# Patient Record
Sex: Male | Born: 1945
Health system: Southern US, Community
[De-identification: ages and names within clinical notes are randomized; demographics above are authoritative.]

## PROBLEM LIST (undated history)

## (undated) DIAGNOSIS — N529 Male erectile dysfunction, unspecified: Secondary | ICD-10-CM

## (undated) DIAGNOSIS — Q441 Other congenital malformations of gallbladder: Secondary | ICD-10-CM

## (undated) DIAGNOSIS — M109 Gout, unspecified: Secondary | ICD-10-CM

## (undated) DIAGNOSIS — W57XXXA Bitten or stung by nonvenomous insect and other nonvenomous arthropods, initial encounter: Secondary | ICD-10-CM

## (undated) DIAGNOSIS — K760 Fatty (change of) liver, not elsewhere classified: Secondary | ICD-10-CM

## (undated) DIAGNOSIS — E785 Hyperlipidemia, unspecified: Secondary | ICD-10-CM

## (undated) HISTORY — DX: Hyperlipidemia, unspecified: E78.5

## (undated) HISTORY — DX: Male erectile dysfunction, unspecified: N52.9

## (undated) HISTORY — DX: Fatty (change of) liver, not elsewhere classified: K76.0

## (undated) HISTORY — DX: Other congenital malformations of gallbladder: Q44.1

---

## 2013-01-14 HISTORY — PX: COLONOSCOPY W/ BIOPSIES: SHX1374

## 2016-02-13 DIAGNOSIS — Z23 Encounter for immunization: Secondary | ICD-10-CM | POA: Diagnosis not present

## 2016-04-05 DIAGNOSIS — E7800 Pure hypercholesterolemia, unspecified: Secondary | ICD-10-CM | POA: Diagnosis not present

## 2016-04-05 DIAGNOSIS — B354 Tinea corporis: Secondary | ICD-10-CM | POA: Diagnosis not present

## 2016-04-05 DIAGNOSIS — Z6828 Body mass index (BMI) 28.0-28.9, adult: Secondary | ICD-10-CM | POA: Diagnosis not present

## 2016-04-05 DIAGNOSIS — Z86010 Personal history of colonic polyps: Secondary | ICD-10-CM | POA: Diagnosis not present

## 2016-04-05 DIAGNOSIS — M109 Gout, unspecified: Secondary | ICD-10-CM | POA: Diagnosis not present

## 2016-05-27 DIAGNOSIS — M25512 Pain in left shoulder: Secondary | ICD-10-CM | POA: Diagnosis not present

## 2016-05-27 DIAGNOSIS — Z6828 Body mass index (BMI) 28.0-28.9, adult: Secondary | ICD-10-CM | POA: Diagnosis not present

## 2016-05-29 DIAGNOSIS — M7552 Bursitis of left shoulder: Secondary | ICD-10-CM | POA: Diagnosis not present

## 2016-05-29 DIAGNOSIS — M75112 Incomplete rotator cuff tear or rupture of left shoulder, not specified as traumatic: Secondary | ICD-10-CM | POA: Diagnosis not present

## 2016-05-29 DIAGNOSIS — M7542 Impingement syndrome of left shoulder: Secondary | ICD-10-CM | POA: Diagnosis not present

## 2016-06-07 DIAGNOSIS — M7542 Impingement syndrome of left shoulder: Secondary | ICD-10-CM | POA: Diagnosis not present

## 2016-06-07 DIAGNOSIS — M75112 Incomplete rotator cuff tear or rupture of left shoulder, not specified as traumatic: Secondary | ICD-10-CM | POA: Diagnosis not present

## 2016-06-07 DIAGNOSIS — M7552 Bursitis of left shoulder: Secondary | ICD-10-CM | POA: Diagnosis not present

## 2016-06-11 DIAGNOSIS — M75112 Incomplete rotator cuff tear or rupture of left shoulder, not specified as traumatic: Secondary | ICD-10-CM | POA: Diagnosis not present

## 2016-06-11 DIAGNOSIS — M7542 Impingement syndrome of left shoulder: Secondary | ICD-10-CM | POA: Diagnosis not present

## 2016-06-11 DIAGNOSIS — M7552 Bursitis of left shoulder: Secondary | ICD-10-CM | POA: Diagnosis not present

## 2016-06-14 DIAGNOSIS — M7542 Impingement syndrome of left shoulder: Secondary | ICD-10-CM | POA: Diagnosis not present

## 2016-06-14 DIAGNOSIS — M7552 Bursitis of left shoulder: Secondary | ICD-10-CM | POA: Diagnosis not present

## 2016-06-14 DIAGNOSIS — M75112 Incomplete rotator cuff tear or rupture of left shoulder, not specified as traumatic: Secondary | ICD-10-CM | POA: Diagnosis not present

## 2016-06-18 DIAGNOSIS — M7542 Impingement syndrome of left shoulder: Secondary | ICD-10-CM | POA: Diagnosis not present

## 2016-06-18 DIAGNOSIS — M75112 Incomplete rotator cuff tear or rupture of left shoulder, not specified as traumatic: Secondary | ICD-10-CM | POA: Diagnosis not present

## 2016-06-18 DIAGNOSIS — M7552 Bursitis of left shoulder: Secondary | ICD-10-CM | POA: Diagnosis not present

## 2016-06-21 DIAGNOSIS — M7552 Bursitis of left shoulder: Secondary | ICD-10-CM | POA: Diagnosis not present

## 2016-06-21 DIAGNOSIS — M75112 Incomplete rotator cuff tear or rupture of left shoulder, not specified as traumatic: Secondary | ICD-10-CM | POA: Diagnosis not present

## 2016-06-21 DIAGNOSIS — M7542 Impingement syndrome of left shoulder: Secondary | ICD-10-CM | POA: Diagnosis not present

## 2016-06-25 DIAGNOSIS — M7542 Impingement syndrome of left shoulder: Secondary | ICD-10-CM | POA: Diagnosis not present

## 2016-06-25 DIAGNOSIS — M75112 Incomplete rotator cuff tear or rupture of left shoulder, not specified as traumatic: Secondary | ICD-10-CM | POA: Diagnosis not present

## 2016-06-25 DIAGNOSIS — M7552 Bursitis of left shoulder: Secondary | ICD-10-CM | POA: Diagnosis not present

## 2016-06-26 DIAGNOSIS — M7542 Impingement syndrome of left shoulder: Secondary | ICD-10-CM | POA: Diagnosis not present

## 2016-06-26 DIAGNOSIS — M75112 Incomplete rotator cuff tear or rupture of left shoulder, not specified as traumatic: Secondary | ICD-10-CM | POA: Diagnosis not present

## 2016-06-26 DIAGNOSIS — M7552 Bursitis of left shoulder: Secondary | ICD-10-CM | POA: Diagnosis not present

## 2016-06-28 DIAGNOSIS — M75112 Incomplete rotator cuff tear or rupture of left shoulder, not specified as traumatic: Secondary | ICD-10-CM | POA: Diagnosis not present

## 2016-06-28 DIAGNOSIS — M7552 Bursitis of left shoulder: Secondary | ICD-10-CM | POA: Diagnosis not present

## 2016-06-28 DIAGNOSIS — M7542 Impingement syndrome of left shoulder: Secondary | ICD-10-CM | POA: Diagnosis not present

## 2016-07-02 DIAGNOSIS — M7542 Impingement syndrome of left shoulder: Secondary | ICD-10-CM | POA: Diagnosis not present

## 2016-07-02 DIAGNOSIS — M7552 Bursitis of left shoulder: Secondary | ICD-10-CM | POA: Diagnosis not present

## 2016-07-02 DIAGNOSIS — M75112 Incomplete rotator cuff tear or rupture of left shoulder, not specified as traumatic: Secondary | ICD-10-CM | POA: Diagnosis not present

## 2016-07-05 DIAGNOSIS — M7552 Bursitis of left shoulder: Secondary | ICD-10-CM | POA: Diagnosis not present

## 2016-07-05 DIAGNOSIS — M7542 Impingement syndrome of left shoulder: Secondary | ICD-10-CM | POA: Diagnosis not present

## 2016-07-05 DIAGNOSIS — M75112 Incomplete rotator cuff tear or rupture of left shoulder, not specified as traumatic: Secondary | ICD-10-CM | POA: Diagnosis not present

## 2016-07-09 DIAGNOSIS — M7552 Bursitis of left shoulder: Secondary | ICD-10-CM | POA: Diagnosis not present

## 2016-07-09 DIAGNOSIS — M75112 Incomplete rotator cuff tear or rupture of left shoulder, not specified as traumatic: Secondary | ICD-10-CM | POA: Diagnosis not present

## 2016-07-09 DIAGNOSIS — M7542 Impingement syndrome of left shoulder: Secondary | ICD-10-CM | POA: Diagnosis not present

## 2016-07-12 DIAGNOSIS — M75112 Incomplete rotator cuff tear or rupture of left shoulder, not specified as traumatic: Secondary | ICD-10-CM | POA: Diagnosis not present

## 2016-07-12 DIAGNOSIS — M7552 Bursitis of left shoulder: Secondary | ICD-10-CM | POA: Diagnosis not present

## 2016-07-12 DIAGNOSIS — M7542 Impingement syndrome of left shoulder: Secondary | ICD-10-CM | POA: Diagnosis not present

## 2016-07-16 DIAGNOSIS — M7542 Impingement syndrome of left shoulder: Secondary | ICD-10-CM | POA: Diagnosis not present

## 2016-07-16 DIAGNOSIS — M7552 Bursitis of left shoulder: Secondary | ICD-10-CM | POA: Diagnosis not present

## 2016-07-16 DIAGNOSIS — M75112 Incomplete rotator cuff tear or rupture of left shoulder, not specified as traumatic: Secondary | ICD-10-CM | POA: Diagnosis not present

## 2016-07-23 DIAGNOSIS — M75112 Incomplete rotator cuff tear or rupture of left shoulder, not specified as traumatic: Secondary | ICD-10-CM | POA: Diagnosis not present

## 2016-07-23 DIAGNOSIS — M7542 Impingement syndrome of left shoulder: Secondary | ICD-10-CM | POA: Diagnosis not present

## 2016-07-23 DIAGNOSIS — M7552 Bursitis of left shoulder: Secondary | ICD-10-CM | POA: Diagnosis not present

## 2016-07-26 DIAGNOSIS — M7542 Impingement syndrome of left shoulder: Secondary | ICD-10-CM | POA: Diagnosis not present

## 2016-07-26 DIAGNOSIS — M7552 Bursitis of left shoulder: Secondary | ICD-10-CM | POA: Diagnosis not present

## 2016-07-26 DIAGNOSIS — M75112 Incomplete rotator cuff tear or rupture of left shoulder, not specified as traumatic: Secondary | ICD-10-CM | POA: Diagnosis not present

## 2016-07-30 DIAGNOSIS — M75112 Incomplete rotator cuff tear or rupture of left shoulder, not specified as traumatic: Secondary | ICD-10-CM | POA: Diagnosis not present

## 2016-07-30 DIAGNOSIS — M7552 Bursitis of left shoulder: Secondary | ICD-10-CM | POA: Diagnosis not present

## 2016-07-30 DIAGNOSIS — M7542 Impingement syndrome of left shoulder: Secondary | ICD-10-CM | POA: Diagnosis not present

## 2016-08-02 DIAGNOSIS — M7552 Bursitis of left shoulder: Secondary | ICD-10-CM | POA: Diagnosis not present

## 2016-08-02 DIAGNOSIS — M75112 Incomplete rotator cuff tear or rupture of left shoulder, not specified as traumatic: Secondary | ICD-10-CM | POA: Diagnosis not present

## 2016-08-02 DIAGNOSIS — M7542 Impingement syndrome of left shoulder: Secondary | ICD-10-CM | POA: Diagnosis not present

## 2016-08-07 DIAGNOSIS — M7542 Impingement syndrome of left shoulder: Secondary | ICD-10-CM | POA: Diagnosis not present

## 2016-08-07 DIAGNOSIS — M7552 Bursitis of left shoulder: Secondary | ICD-10-CM | POA: Diagnosis not present

## 2016-08-07 DIAGNOSIS — M75112 Incomplete rotator cuff tear or rupture of left shoulder, not specified as traumatic: Secondary | ICD-10-CM | POA: Diagnosis not present

## 2016-09-26 DIAGNOSIS — R1011 Right upper quadrant pain: Secondary | ICD-10-CM | POA: Diagnosis not present

## 2016-09-26 DIAGNOSIS — Z6825 Body mass index (BMI) 25.0-25.9, adult: Secondary | ICD-10-CM | POA: Diagnosis not present

## 2016-09-26 DIAGNOSIS — R351 Nocturia: Secondary | ICD-10-CM | POA: Diagnosis not present

## 2016-09-26 DIAGNOSIS — E7800 Pure hypercholesterolemia, unspecified: Secondary | ICD-10-CM | POA: Diagnosis not present

## 2016-09-26 DIAGNOSIS — Z0000 Encounter for general adult medical examination without abnormal findings: Secondary | ICD-10-CM | POA: Diagnosis not present

## 2016-10-03 DIAGNOSIS — R351 Nocturia: Secondary | ICD-10-CM | POA: Diagnosis not present

## 2016-10-03 DIAGNOSIS — R1011 Right upper quadrant pain: Secondary | ICD-10-CM | POA: Diagnosis not present

## 2016-10-03 DIAGNOSIS — Z0000 Encounter for general adult medical examination without abnormal findings: Secondary | ICD-10-CM | POA: Diagnosis not present

## 2016-10-03 DIAGNOSIS — Z125 Encounter for screening for malignant neoplasm of prostate: Secondary | ICD-10-CM | POA: Diagnosis not present

## 2016-10-03 DIAGNOSIS — E7800 Pure hypercholesterolemia, unspecified: Secondary | ICD-10-CM | POA: Diagnosis not present

## 2016-10-07 DIAGNOSIS — Z23 Encounter for immunization: Secondary | ICD-10-CM | POA: Diagnosis not present

## 2016-10-10 DIAGNOSIS — R1011 Right upper quadrant pain: Secondary | ICD-10-CM | POA: Diagnosis not present

## 2016-10-10 DIAGNOSIS — R948 Abnormal results of function studies of other organs and systems: Secondary | ICD-10-CM | POA: Diagnosis not present

## 2016-10-15 DIAGNOSIS — K828 Other specified diseases of gallbladder: Secondary | ICD-10-CM | POA: Diagnosis not present

## 2017-06-11 ENCOUNTER — Encounter: Payer: Self-pay | Admitting: Internal Medicine

## 2017-07-29 DIAGNOSIS — Z13220 Encounter for screening for lipoid disorders: Secondary | ICD-10-CM | POA: Diagnosis not present

## 2017-07-29 DIAGNOSIS — Z131 Encounter for screening for diabetes mellitus: Secondary | ICD-10-CM | POA: Diagnosis not present

## 2017-07-29 DIAGNOSIS — K760 Fatty (change of) liver, not elsewhere classified: Secondary | ICD-10-CM | POA: Diagnosis not present

## 2017-07-29 DIAGNOSIS — Z0001 Encounter for general adult medical examination with abnormal findings: Secondary | ICD-10-CM | POA: Diagnosis not present

## 2017-07-29 DIAGNOSIS — E559 Vitamin D deficiency, unspecified: Secondary | ICD-10-CM | POA: Diagnosis not present

## 2017-07-29 DIAGNOSIS — N529 Male erectile dysfunction, unspecified: Secondary | ICD-10-CM | POA: Diagnosis not present

## 2017-07-29 DIAGNOSIS — R760 Raised antibody titer: Secondary | ICD-10-CM | POA: Diagnosis not present

## 2017-07-29 DIAGNOSIS — R5383 Other fatigue: Secondary | ICD-10-CM | POA: Diagnosis not present

## 2017-07-29 DIAGNOSIS — E785 Hyperlipidemia, unspecified: Secondary | ICD-10-CM | POA: Diagnosis not present

## 2017-07-29 DIAGNOSIS — M1009 Idiopathic gout, multiple sites: Secondary | ICD-10-CM | POA: Diagnosis not present

## 2017-08-07 ENCOUNTER — Ambulatory Visit: Payer: Self-pay | Admitting: Nurse Practitioner

## 2017-09-16 DIAGNOSIS — Z23 Encounter for immunization: Secondary | ICD-10-CM | POA: Diagnosis not present

## 2017-09-18 DIAGNOSIS — R5383 Other fatigue: Secondary | ICD-10-CM | POA: Diagnosis not present

## 2017-09-18 DIAGNOSIS — E785 Hyperlipidemia, unspecified: Secondary | ICD-10-CM | POA: Diagnosis not present

## 2017-12-24 DIAGNOSIS — K760 Fatty (change of) liver, not elsewhere classified: Secondary | ICD-10-CM | POA: Diagnosis not present

## 2017-12-24 DIAGNOSIS — M6281 Muscle weakness (generalized): Secondary | ICD-10-CM | POA: Diagnosis not present

## 2017-12-24 DIAGNOSIS — N529 Male erectile dysfunction, unspecified: Secondary | ICD-10-CM | POA: Diagnosis not present

## 2017-12-24 DIAGNOSIS — E785 Hyperlipidemia, unspecified: Secondary | ICD-10-CM | POA: Diagnosis not present

## 2018-01-26 DIAGNOSIS — E785 Hyperlipidemia, unspecified: Secondary | ICD-10-CM | POA: Diagnosis not present

## 2018-04-27 DIAGNOSIS — E785 Hyperlipidemia, unspecified: Secondary | ICD-10-CM | POA: Diagnosis not present

## 2018-04-27 DIAGNOSIS — L0390 Cellulitis, unspecified: Secondary | ICD-10-CM | POA: Diagnosis not present

## 2018-05-12 ENCOUNTER — Emergency Department (HOSPITAL_COMMUNITY)
Admission: EM | Admit: 2018-05-12 | Discharge: 2018-05-12 | Disposition: A | Discharge status: Home / Self Care | Payer: Medicare Other | Attending: Emergency Medicine | Admitting: Emergency Medicine

## 2018-05-12 ENCOUNTER — Encounter (HOSPITAL_COMMUNITY): Payer: Self-pay | Admitting: *Deleted

## 2018-05-12 ENCOUNTER — Emergency Department (HOSPITAL_COMMUNITY): Payer: Medicare Other

## 2018-05-12 ENCOUNTER — Other Ambulatory Visit: Payer: Self-pay

## 2018-05-12 DIAGNOSIS — S61316A Laceration without foreign body of right little finger with damage to nail, initial encounter: Secondary | ICD-10-CM | POA: Diagnosis not present

## 2018-05-12 DIAGNOSIS — Y9289 Other specified places as the place of occurrence of the external cause: Secondary | ICD-10-CM | POA: Diagnosis not present

## 2018-05-12 DIAGNOSIS — Y9389 Activity, other specified: Secondary | ICD-10-CM | POA: Insufficient documentation

## 2018-05-12 DIAGNOSIS — Y998 Other external cause status: Secondary | ICD-10-CM | POA: Insufficient documentation

## 2018-05-12 DIAGNOSIS — Z23 Encounter for immunization: Secondary | ICD-10-CM | POA: Insufficient documentation

## 2018-05-12 DIAGNOSIS — S61216A Laceration without foreign body of right little finger without damage to nail, initial encounter: Secondary | ICD-10-CM | POA: Insufficient documentation

## 2018-05-12 DIAGNOSIS — W230XXA Caught, crushed, jammed, or pinched between moving objects, initial encounter: Secondary | ICD-10-CM | POA: Diagnosis not present

## 2018-05-12 HISTORY — DX: Gout, unspecified: M10.9

## 2018-05-12 HISTORY — DX: Bitten or stung by nonvenomous insect and other nonvenomous arthropods, initial encounter: W57.XXXA

## 2018-05-12 MED ORDER — TETANUS-DIPHTH-ACELL PERTUSSIS 5-2.5-18.5 LF-MCG/0.5 IM SUSP
0.5000 mL | Freq: Once | INTRAMUSCULAR | Status: AC
  Administered 2018-05-12: 0.5 mL via INTRAMUSCULAR
  Filled 2018-05-12: qty 0.5

## 2018-05-12 MED ORDER — POVIDONE-IODINE 10 % EX SOLN
CUTANEOUS | Status: AC
  Administered 2018-05-12: 1
  Filled 2018-05-12: qty 15

## 2018-05-12 MED ORDER — IBUPROFEN 400 MG PO TABS: 400.0000 mg | ORAL_TABLET | Freq: Four times a day (QID) | ORAL | 0 refills | Status: DC | PRN

## 2018-05-12 MED ORDER — DOUBLE ANTIBIOTIC 500-10000 UNIT/GM EX OINT
TOPICAL_OINTMENT | Freq: Once | CUTANEOUS | Status: AC
  Administered 2018-05-12: 1 via TOPICAL
  Filled 2018-05-12: qty 2

## 2018-05-12 MED ORDER — BACITRACIN ZINC 500 UNIT/GM EX OINT: 1.0000 "application " | TOPICAL_OINTMENT | Freq: Two times a day (BID) | CUTANEOUS | 0 refills | Status: DC

## 2018-05-12 MED ORDER — CEPHALEXIN 500 MG PO CAPS: 500.0000 mg | ORAL_CAPSULE | Freq: Three times a day (TID) | ORAL | 0 refills | Status: AC

## 2018-05-12 MED ORDER — LIDOCAINE-EPINEPHRINE (PF) 2 %-1:200000 IJ SOLN
20.0000 mL | Freq: Once | INTRAMUSCULAR | Status: AC
  Administered 2018-05-12: 10 mL via INTRADERMAL
  Filled 2018-05-12: qty 20

## 2018-05-12 NOTE — ED Triage Notes (Signed)
Pt with laceration to right pinky finger from a propeller from an airplane operated from a remote controller. Last tetanus unknown.

## 2018-05-12 NOTE — ED Provider Notes (Signed)
[facility] EMERGENCY DEPARTMENT Provider Note   CSN: [id number] Arrival date & time: 05/12/18  1545    History   Chief Complaint Chief Complaint  Patient presents with  . Laceration    HPI Edward Williamson is a 73 y.o. male.     HPI  73 y/o male - accidentally got his finger caught in the prop of his small radio controlled airplane and caused multiple lacerations to his R small finger on the volar surface - he has no idea about last tdap.  Sx were acute in onset Occurred 1 hour ago Constant pain that is worse with palpatoin assocaited with some bleeding - not on anticoag. Applied pressure dressing prehospital  Past Medical History:  Diagnosis Date  . Gout   . Tick bite     There are no active problems to display for this patient.   History reviewed. No pertinent surgical history.      Home Medications    Prior to Admission medications   Medication Sig Start Date End Date Taking? Authorizing Provider  bacitracin ointment Apply 1 application topically 2 (two) times daily. 05/12/18   Noemi Chapel, MD  cephALEXin (KEFLEX) 500 MG capsule Take 1 capsule (500 mg total) by mouth 3 (three) times daily for 7 days. 05/12/18 05/19/18  Noemi Chapel, MD  ibuprofen (ADVIL) 400 MG tablet Take 1 tablet (400 mg total) by mouth every 6 (six) hours as needed. 05/12/18   Noemi Chapel, MD    Family History History reviewed. No pertinent family history.  Social History Social History   Tobacco Use  . Smoking status: Never Smoker  . Smokeless tobacco: Never Used  Substance Use Topics  . Alcohol use: Yes    Frequency: Never  . Drug use: Never     Allergies   Patient has no known allergies.   Review of Systems Review of Systems  Constitutional: Negative for fever.  Gastrointestinal: Negative for vomiting.  Skin: Positive for wound.       Laceration  Neurological: Negative for weakness and numbness.     Physical Exam Updated Vital Signs BP (!) 168/81 (BP Location:  Left Arm)   Pulse 92   Temp 97.6 F (36.4 C) (Oral)   Resp 12   Ht 1.803 m (5\' 11" )   Wt 83 kg   SpO2 99%   BMI 25.52 kg/m   Physical Exam Constitutional:      General: He is not in acute distress.    Appearance: He is well-developed. He is not diaphoretic.  HENT:     Head: Normocephalic.  Eyes:     General: No scleral icterus.    Conjunctiva/sclera: Conjunctivae normal.  Cardiovascular:     Rate and Rhythm: Normal rate and regular rhythm.  Pulmonary:     Effort: Pulmonary effort is normal.     Breath sounds: Normal breath sounds.  Musculoskeletal: Normal range of motion.        General: Tenderness ( ttp over the laceration site) present.  Skin:    General: Skin is warm and dry.     Comments: Laceration located on R small finger - volar surface from MCP through middle phalanx The Laceration is linear shaped 6 lac's somewhat parralell to eachother interconnecting The depth is 2 The length is 8 total cm  Neurological:     Mental Status: He is alert.     Coordination: Coordination normal.     Comments: Sensation and motor intact      ED Treatments /  Results  Labs (all labs ordered are listed, but only abnormal results are displayed) Labs Reviewed - No data to display  EKG None  Radiology Dg Finger Little Right  Result Date: 05/12/2018 CLINICAL DATA:  Laceration RIGHT little finger from an RC airplane propeller EXAM: RIGHT LITTLE FINGER 2+V COMPARISON:  None FINDINGS: Dressing artifacts and soft tissue irregularity at ulnar and volar aspects of RIGHT little finger. Osseous mineralization normal. Joint spaces preserved. No acute fracture, dislocation, or bone destruction. IMPRESSION: No acute osseous abnormalities. Electronically Signed   By: Lavonia Dana M.D.   On: 05/12/2018 16:28    Procedures .Marland KitchenLaceration Repair Date/Time: 05/12/2018 5:06 PM Performed by: Noemi Chapel, MD Authorized by: Noemi Chapel, MD   Consent:    Consent obtained:  Verbal   Consent  given by:  Patient   Risks discussed:  Infection, pain, need for additional repair, poor cosmetic result and poor wound healing   Alternatives discussed:  No treatment and delayed treatment Anesthesia (see MAR for exact dosages):    Anesthesia method:  Local infiltration   Local anesthetic:  Lidocaine 1% WITH epi Laceration details:    Location:  Finger   Finger location:  R small finger   Length (cm):  8   Depth (mm):  2 Repair type:    Repair type:  Simple Pre-procedure details:    Preparation:  Patient was prepped and draped in usual sterile fashion and imaging obtained to evaluate for foreign bodies Exploration:    Hemostasis achieved with:  Direct pressure   Wound exploration: wound explored through full range of motion and entire depth of wound probed and visualized     Wound extent: no fascia violation noted, no foreign bodies/material noted, no muscle damage noted, no nerve damage noted, no tendon damage noted, no underlying fracture noted and no vascular damage noted     Contaminated: no   Treatment:    Area cleansed with:  Betadine and saline   Amount of cleaning:  Extensive   Irrigation solution:  Sterile saline   Irrigation volume:  2000   Irrigation method:  Syringe Skin repair:    Repair method:  Sutures   Suture size:  4-0   Suture material:  Prolene   Suture technique:  Simple interrupted   Number of sutures:  17 Approximation:    Approximation:  Close Post-procedure details:    Dressing:  Antibiotic ointment, sterile dressing and non-adherent dressing   Patient tolerance of procedure:  Tolerated well, no immediate complications Comments:          (including critical care time)  Medications Ordered in ED Medications  polymixin-bacitracin (POLYSPORIN) ointment (has no administration in time range)  lidocaine-EPINEPHrine (XYLOCAINE W/EPI) 2 %-1:200000 (PF) injection 20 mL (10 mLs Intradermal Given by Other 05/12/18 1558)  Tdap (BOOSTRIX) injection 0.5 mL  (0.5 mLs Intramuscular Given 05/12/18 1556)  povidone-iodine (BETADINE) 10 % external solution (1 application  Given 7/67/34 1558)     Initial Impression / Assessment and Plan / ED Course  I have reviewed the triage vital signs and the nursing notes.  Pertinent labs & imaging results that were available during my care of the patient were reviewed by me and considered in my medical decision making (see chart for details).       Imaging obtained Block placed Primary repair Update tdap - wound care and finger splint Pt agreeable.  See wound repair note - these were not technically 6 lacerations individually as the rotor blade from the [facility]  plane caused a confluence of semi parallel lac's that were repaired with total of 17 sutures   Pt tolerate it  No FB, no lac's Normal flexor and extensor function and normal sensation pre and pos repair.  Given f/u instructions Agreeable.  Final Clinical Impressions(s) / ED Diagnoses   Final diagnoses:  Laceration of right little finger without foreign body without damage to nail, initial encounter    ED Discharge Orders         Ordered    ibuprofen (ADVIL) 400 MG tablet  Every 6 hours PRN     05/12/18 1710    bacitracin ointment  2 times daily     05/12/18 1710    cephALEXin (KEFLEX) 500 MG capsule  3 times daily     05/12/18 1710           Noemi Chapel, MD 05/12/18 1711

## 2018-05-12 NOTE — Discharge Instructions (Signed)
Have your doctor take the stitches out in 7-10 days -  If you have redness, increased pain / swelling / pus or fever - start taking keflex and see a doctor immediately.  Keep clean and dry for 72 hours - other than topical antibiotics, Motrin 3 times daily for pain

## 2018-05-21 DIAGNOSIS — E559 Vitamin D deficiency, unspecified: Secondary | ICD-10-CM | POA: Diagnosis not present

## 2018-05-21 DIAGNOSIS — S61219A Laceration without foreign body of unspecified finger without damage to nail, initial encounter: Secondary | ICD-10-CM | POA: Diagnosis not present

## 2018-05-21 DIAGNOSIS — K760 Fatty (change of) liver, not elsewhere classified: Secondary | ICD-10-CM | POA: Diagnosis not present

## 2018-05-21 DIAGNOSIS — E785 Hyperlipidemia, unspecified: Secondary | ICD-10-CM | POA: Diagnosis not present

## 2018-08-27 DIAGNOSIS — E559 Vitamin D deficiency, unspecified: Secondary | ICD-10-CM | POA: Diagnosis not present

## 2018-08-27 DIAGNOSIS — E785 Hyperlipidemia, unspecified: Secondary | ICD-10-CM | POA: Diagnosis not present

## 2018-08-27 DIAGNOSIS — K760 Fatty (change of) liver, not elsewhere classified: Secondary | ICD-10-CM | POA: Diagnosis not present

## 2018-10-29 ENCOUNTER — Ambulatory Visit (INDEPENDENT_AMBULATORY_CARE_PROVIDER_SITE_OTHER): Payer: Medicare Other

## 2018-10-29 ENCOUNTER — Other Ambulatory Visit: Payer: Self-pay

## 2018-10-29 DIAGNOSIS — Z23 Encounter for immunization: Secondary | ICD-10-CM | POA: Diagnosis not present

## 2018-12-24 ENCOUNTER — Encounter (INDEPENDENT_AMBULATORY_CARE_PROVIDER_SITE_OTHER): Payer: Self-pay | Admitting: Nurse Practitioner

## 2018-12-24 ENCOUNTER — Ambulatory Visit (INDEPENDENT_AMBULATORY_CARE_PROVIDER_SITE_OTHER): Payer: Medicare Other | Admitting: Internal Medicine

## 2018-12-24 ENCOUNTER — Ambulatory Visit (INDEPENDENT_AMBULATORY_CARE_PROVIDER_SITE_OTHER): Payer: Medicare Other | Admitting: Nurse Practitioner

## 2018-12-24 ENCOUNTER — Other Ambulatory Visit: Payer: Self-pay

## 2018-12-24 VITALS — BP 132/74 | HR 50 | Temp 97.6°F | Resp 14 | Ht 71.0 in | Wt 187.6 lb

## 2018-12-24 DIAGNOSIS — R351 Nocturia: Secondary | ICD-10-CM

## 2018-12-24 DIAGNOSIS — E785 Hyperlipidemia, unspecified: Secondary | ICD-10-CM | POA: Insufficient documentation

## 2018-12-24 DIAGNOSIS — Z1159 Encounter for screening for other viral diseases: Secondary | ICD-10-CM | POA: Diagnosis not present

## 2018-12-24 DIAGNOSIS — Z0001 Encounter for general adult medical examination with abnormal findings: Secondary | ICD-10-CM

## 2018-12-24 DIAGNOSIS — Z125 Encounter for screening for malignant neoplasm of prostate: Secondary | ICD-10-CM

## 2018-12-24 DIAGNOSIS — Z0000 Encounter for general adult medical examination without abnormal findings: Secondary | ICD-10-CM

## 2018-12-24 DIAGNOSIS — Z1211 Encounter for screening for malignant neoplasm of colon: Secondary | ICD-10-CM

## 2018-12-24 DIAGNOSIS — E559 Vitamin D deficiency, unspecified: Secondary | ICD-10-CM

## 2018-12-24 DIAGNOSIS — R39198 Other difficulties with micturition: Secondary | ICD-10-CM

## 2018-12-24 DIAGNOSIS — Z131 Encounter for screening for diabetes mellitus: Secondary | ICD-10-CM

## 2018-12-24 HISTORY — DX: Vitamin D deficiency, unspecified: E55.9

## 2018-12-24 NOTE — Patient Instructions (Signed)
Thank you for choosing [facility] as your medical provider! If you have any questions or concerns regarding your health care, please do not hesitate to call our office.  You are up-to-date with all vaccinations except for shingles.  If you decide that you want this please contact your pharmacy.  I will refer you to gastroenterology for colon cancer screening.  We will not do sexually transmitted infection screening per your request.  We will also screen for hepatitis C and prostate cancer today.  Further recommendations will be made based upon these results.  As we discussed we will hold off on additional medication recommendations for cholesterol until we see what her lipid panel shows.  I will also collect a vitamin D level today and I will let you know what these results show.  You may receive these results either in a letter in the mail or our office will call you.  If you have any questions prior to your next appointment for while you are awaiting lab results please do not hesitate to call us.  Please follow-up as scheduled in 3 months. We look forward to seeing you again soon! Have a great holiday season!!  At [facility] we value your feedback. You may receive a survey about your visit today. Please share your experience as we strive to create trusting relationships with our patients to provide genuine, compassionate, quality care.  We appreciate your understanding and patience as we review any laboratory studies, imaging, and other diagnostic tests that are ordered as we care for you. We do our best to address any and all results in a timely manner. If you do not hear about test results within 1 week, please do not hesitate to contact us. If we referred you to a specialist during your visit or ordered imaging testing, contact the office if you have not been contacted to be scheduled within 1 weeks.  We also encourage the use of MyChart, which contains your medical  information for your review as well. If you are not enrolled in this feature, an access code is on this after visit summary for your convenience. Thank you for allowing [place] to be involved in your care.

## 2018-12-24 NOTE — Progress Notes (Signed)
Subjective:  Patient ID: Edward Williamson, male    DOB: 06-02-1945  Age: 73 y.o. MRN: [id number]  CC:  Chief Complaint  Patient presents with  . Follow-up      HPI  This patient also presents today for a follow-up of chronic conditions.  Hyperlipidemia: He does have a history of hyperlipidemia and used to be on statin therapy but is no longer taking this.  Last lipid panel showed total cholesterol 219, HDL of 37, triglycerides 134, and LDL 150.  Current ASCVD risk score is approximately 29%.  However this is calculated using the [employer] of cardiology risk calculator which is generally used for patients aged 30-70.  He does continue on fish oil supplement.  Vitamin D deficiency.  He is currently on 10,000 IUs of vitamin D daily.  Past Medical History:  Diagnosis Date  . Erectile dysfunction   . Fatty liver   . 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 Gout   . Hyperlipidemia   . Tick bite   . Vitamin D deficiency       Family History  Problem Relation Age of Onset  . Scoliosis Mother   . Heart attack Father   . Heart disease Father   . Heart disease Brother   . Heart attack Maternal Grandmother   . Depression Paternal Grandfather        suicide    Social History   Social History Narrative  . Not on file   Social History   Tobacco Use  . Smoking status: Never Smoker  . Smokeless tobacco: Never Used  Substance Use Topics  . Alcohol use: Yes    Alcohol/week: 1.0 standard drinks    Types: 1 Cans of beer per week    Comment: once per week in summer; cocktail (1) every 1-2 weeks     Current Meds  Medication Sig  . allopurinol (ZYLOPRIM) 300 MG tablet Take 300 mg by mouth daily.  . Cholecalciferol 1.25 MG (50000 UT) TABS Take 2 tablets by mouth.  . Digestive Enzymes (DIGEST III PO) Take 1 tablet by mouth. As needed  . ibuprofen (ADVIL) 400 MG tablet Take 1 tablet (400 mg total) by mouth every 6 (six) hours as needed.  . Misc Natural Products (OSTEO  BI-FLEX ADV TRIPLE ST PO) Take 1 tablet by mouth.  . NON FORMULARY 1 tablet. Alpha CRS+  . omega-3 acid ethyl esters (LOVAZA) 1 g capsule Take 2 g by mouth 2 (two) times daily.  . vitamin C (ASCORBIC ACID) 500 MG tablet Take 500 mg by mouth daily.  . Zinc 100 MG TABS Take 1 tablet by mouth.    ROS:  Review of Systems  Constitutional: Negative.   Eyes: Positive for blurred vision.  Respiratory: Negative.   Cardiovascular: Negative.   Genitourinary: Negative for hematuria.       Nocturia, weakened stream towards end of micturation     Objective:   Today's Vitals: BP 132/74   Pulse (!) 50   Temp 97.6 F (36.4 C)   Resp 14   Ht 5\' 11"  (1.803 m)   Wt 187 lb 9.6 oz (85.1 kg)   SpO2 95%   BMI 26.16 kg/m  Vitals with BMI 12/24/2018 05/12/2018 05/12/2018  Height 5\' 11"  - -  Weight 187 lbs 10 oz - -  BMI 99991111 - -  Systolic Q000111Q Q000111Q [id number]  Diastolic 74 80 81  Pulse 50 - 92  Physical Exam Vitals reviewed.  Constitutional:      Appearance: Normal appearance.  HENT:     Head: Normocephalic and atraumatic.  Cardiovascular:     Rate and Rhythm: Normal rate and regular rhythm.  Pulmonary:     Effort: Pulmonary effort is normal.     Breath sounds: Normal breath sounds.  Musculoskeletal:     Cervical back: Neck supple.  Skin:    General: Skin is warm and dry.  Neurological:     Mental Status: He is alert and oriented to person, place, and time.  Psychiatric:        Mood and Affect: Mood normal.        Behavior: Behavior normal.        Thought Content: Thought content normal.        Judgment: Judgment normal.          Assessment   1. Screen for colon cancer   2. Need for hepatitis C screening test   3. Hyperlipidemia, unspecified hyperlipidemia type   4. Screening for diabetes mellitus   5. Vitamin D deficiency   6. Prostate cancer screening   7. Encounter for Medicare annual wellness exam   8. Nocturia   9. Decreased urine stream       Tests  ordered Orders Placed This Encounter  Procedures  . Hep C Antibody  . Lipid Panel  . Vitamin D, 25-hydroxy  . Basic Metabolic Panel  . PSA  . Ambulatory referral to Gastroenterology     Plan: Please see assessment and plan per problem list below.   No orders of the defined types were placed in this encounter.   Patient to follow-up in 3 months.  Ailene Ards, NP

## 2018-12-24 NOTE — Assessment & Plan Note (Signed)
He will continue on current supplement dose.  We will collect serum level today for further evaluation.  Further recommendations may be made based upon these results.

## 2018-12-24 NOTE — Assessment & Plan Note (Signed)
We will collect fasting lipid panel today for further evaluation.  I did discuss risks and benefits of treatment with statin and/or aspirin therapy.  He has been trying to eat healthy and has maintained some level of physical activity but has not been exercising very regularly.  Will await blood work results before proceeding with treatment plan.

## 2018-12-24 NOTE — Progress Notes (Signed)
Subjective:   Edward Williamson is a 73 y.o. male who presents for Medicare Annual/Subsequent preventive examination.  Review of Systems:  Negative except for as listed in progress note for office visit dated 12/24/2018       Objective:    Vitals: BP 132/74   Pulse (!) 50   Temp 97.6 F (36.4 C)   Resp 14   Ht 5\' 11"  (1.803 m)   Wt 187 lb 9.6 oz (85.1 kg)   SpO2 95%   BMI 26.16 kg/m   Body mass index is 26.16 kg/m.  Advanced Directives 12/24/2018  Does Patient Have a Medical Advance Directive? Yes  Type of Advance Directive Living will  Does patient want to make changes to medical advance directive? No - Patient declined    Tobacco Social History   Tobacco Use  Smoking Status Never Smoker  Smokeless Tobacco Never Used     Counseling given: No   Clinical Intake:  Pre-visit preparation completed: Yes  Pain : No/denies pain     Nutritional Status: BMI of 19-24  Normal Nutritional Risks: None Diabetes: No  How often do you need to have someone help you when you read instructions, pamphlets, or other written materials from your doctor or pharmacy?: 1 - Never What is the last grade level you completed in school?: 4-year college degree  Interpreter Needed?: No  Information entered by :: Jeralyn Ruths, NP-C  Past Medical History:  Diagnosis Date  . Erectile dysfunction   . Fatty liver   . 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 Gout   . Hyperlipidemia   . Tick bite   . Vitamin D deficiency   . Vitamin D deficiency 12/24/2018   History reviewed. No pertinent surgical history. Family History  Problem Relation Age of Onset  . Scoliosis Mother   . Heart attack Father   . Heart disease Father   . Heart disease Brother   . Heart attack Maternal Grandmother   . Depression Paternal Grandfather        suicide   Social History   Socioeconomic History  . Marital status: Divorced    Spouse name: Not on file  . Number of children: 2  . Years of education: 16  years  . Highest education level: Not on file  Occupational History  . Occupation: Retired  . Occupation: Curator  Tobacco Use  . Smoking status: Never Smoker  . Smokeless tobacco: Never Used  Substance and Sexual Activity  . Alcohol use: Yes    Alcohol/week: 1.0 standard drinks    Types: 1 Cans of beer per week    Comment: once per week in summer; cocktail (1) every 1-2 weeks  . Drug use: Never  . Sexual activity: Not on file  Other Topics Concern  . Not on file  Social History Narrative  . Not on file   Social Determinants of Health   Financial Resource Strain:   . Difficulty of Paying Living Expenses: Not on file  Food Insecurity:   . Worried About Charity fundraiser in the Last Year: Not on file  . Ran Out of Food in the Last Year: Not on file  Transportation Needs:   . Lack of Transportation (Medical): Not on file  . Lack of Transportation (Non-Medical): Not on file  Physical Activity:   . Days of Exercise per Week: Not on file  . Minutes of Exercise per Session: Not on file  Stress:   .  Feeling of Stress : Not on file  Social Connections:   . Frequency of Communication with Friends and Family: Not on file  . Frequency of Social Gatherings with Friends and Family: Not on file  . Attends Religious Services: Not on file  . Active Member of Clubs or Organizations: Not on file  . Attends Archivist Meetings: Not on file  . Marital Status: Not on file    Outpatient Encounter Medications as of 12/24/2018  Medication Sig  . allopurinol (ZYLOPRIM) 300 MG tablet Take 300 mg by mouth daily.  . Cholecalciferol 1.25 MG (50000 UT) TABS Take 2 tablets by mouth.  . Digestive Enzymes (DIGEST III PO) Take 1 tablet by mouth. As needed  . ibuprofen (ADVIL) 400 MG tablet Take 1 tablet (400 mg total) by mouth every 6 (six) hours as needed.  . Misc Natural Products (OSTEO BI-FLEX ADV TRIPLE ST PO) Take 1 tablet by mouth.  . NON FORMULARY 1 tablet.  Alpha CRS+  . omega-3 acid ethyl esters (LOVAZA) 1 g capsule Take 2 g by mouth 2 (two) times daily.  . vitamin C (ASCORBIC ACID) 500 MG tablet Take 500 mg by mouth daily.  . Zinc 100 MG TABS Take 1 tablet by mouth.  . [DISCONTINUED] bacitracin ointment Apply 1 application topically 2 (two) times daily.   No facility-administered encounter medications on file as of 12/24/2018.    Activities of Daily Living In your present state of health, do you have any difficulty performing the following activities: 12/24/2018  Hearing? N  Vision? N  Difficulty concentrating or making decisions? N  Walking or climbing stairs? N  Dressing or bathing? N  Doing errands, shopping? N  Some recent data might be hidden    Patient Care Team: Doree Albee, MD as PCP - General (Internal Medicine) Gala Romney Cristopher Estimable, MD as Consulting Physician (Gastroenterology)   Assessment:   This is a routine wellness examination for Edward Williamson.  Exercise Activities and Dietary recommendations    Goals   None     Fall Risk Fall Risk  12/24/2018  Falls in the past year? 0   Is the patient's home free of loose throw rugs in walkways, pet beds, electrical cords, etc?   yes      Grab bars in the bathroom? no      Handrails on the stairs?   no not applicable      Adequate lighting?   yes    Depression Screen PHQ 2/9 Scores 12/24/2018  PHQ - 2 Score 0    Cognitive Function     6CIT Screen 12/24/2018  What Year? 0 points  What month? 0 points  What time? 0 points  Count back from 20 0 points  Months in reverse 0 points  Repeat phrase 0 points  Total Score 0    Immunization History  Administered Date(s) Administered  . Fluad Quad(high Dose 65+) 10/29/2018  . Pneumococcal Conjugate-13 10/14/2016  . Pneumococcal Polysaccharide-23 11/05/2013  . Tdap 05/12/2018    Qualifies for Shingles Vaccine? yes  Screening Tests Health Maintenance  Topic Date Due  . Hepatitis C Screening  02/13/1945  .  COLONOSCOPY  11/09/1993  . TETANUS/TDAP  05/11/2028  . INFLUENZA VACCINE  Completed  . PNA vac Low Risk Adult  Completed   Cancer Screenings: Lung: Low Dose CT Chest recommended if Age 34-80 years, 30 pack-year currently smoking OR have quit w/in 15years. Patient does not qualify. Colorectal: Due now  Additional Screenings:  Hepatitis  C Screening: Will complete today      Plan:   Patient is up-to-date with all immunizations except for shingles vaccine.  We do not provide this in this office, thus I told him if he would like this vaccine to discuss this with his pharmacy.  He tells me he understands.  He is due for colon cancer screening, sexual transmitted infection screening, and hepatitis C screening.  We will refer him to gastroenterology for colon cancer screening and collect blood work for hepatitis C screening today.  He declined to have sexual transmitted infection screening completed today.  He also asked about prostate cancer screening especially because he has been experiencing nocturia and weak stream of urine towards the end of micturition.  Last PSA was collected by his previous provider in 2018 and was reported as 3.5.  I had a long risk versus benefits conversation with this patient and he would like to have prostate cancer screening via PSA today.  I will order this blood test today.  He also asked about possible screenings for pancreatic cancer and I did inform him that there are no good screening test for pancreatic cancer.  He does have intermittent abdominal pain related to gallbladder disease.  I encouraged him that if his pain returns let us know we could consider CT scan of the abdomen to evaluate the cause of the pain.  He tells me he understands.  We also discussed advance care planning today.  He does not want to fill out a MOST form but tells me he does have a living will in place.  He will try to bring a copy of this to this office.  I have personally reviewed and  noted the following in the patient's chart:   . Medical and social history . Use of alcohol, tobacco or illicit drugs  . Current medications and supplements . Functional ability and status . Nutritional status . Physical activity . Advanced directives . List of other physicians . Hospitalizations, surgeries, and ER visits in previous 12 months . Vitals . Screenings to include cognitive, depression, and falls . Referrals and appointments  In addition, I have reviewed and discussed with patient certain preventive protocols, quality metrics, and best practice recommendations. A written personalized care plan for preventive services as well as general preventive health recommendations were provided to patient.  In addition to performing his annual Medicare wellness visit I did perform an office visit.  Please look at progress note for office visit dated 12/24/2018.     Ailene Ards, NP  12/24/2018

## 2018-12-25 ENCOUNTER — Other Ambulatory Visit (INDEPENDENT_AMBULATORY_CARE_PROVIDER_SITE_OTHER): Payer: Self-pay | Admitting: Nurse Practitioner

## 2018-12-25 ENCOUNTER — Telehealth (INDEPENDENT_AMBULATORY_CARE_PROVIDER_SITE_OTHER): Payer: Self-pay | Admitting: Nurse Practitioner

## 2018-12-25 LAB — BASIC METABOLIC PANEL
BUN: 16 mg/dL (ref 7–25)
CO2: 25 mmol/L (ref 20–32)
Calcium: 9.6 mg/dL (ref 8.6–10.3)
Chloride: 104 mmol/L (ref 98–110)
Creat: 0.88 mg/dL (ref 0.70–1.18)
Glucose, Bld: 91 mg/dL (ref 65–99)
Potassium: 5.1 mmol/L (ref 3.5–5.3)
Sodium: 140 mmol/L (ref 135–146)

## 2018-12-25 LAB — HEPATITIS C ANTIBODY
Hepatitis C Ab: NONREACTIVE
SIGNAL TO CUT-OFF: 0.02 (ref <1.00)

## 2018-12-25 LAB — LIPID PANEL
Cholesterol: 228 mg/dL — ABNORMAL HIGH (ref <200)
HDL: 46 mg/dL (ref >=40)
LDL Cholesterol (Calc): 161 mg/dL (calc) — ABNORMAL HIGH
Non-HDL Cholesterol (Calc): 182 mg/dL (calc) — ABNORMAL HIGH (ref <130)
Total CHOL/HDL Ratio: 5 (calc) — ABNORMAL HIGH (ref <5.0)
Triglycerides: 98 mg/dL (ref <150)

## 2018-12-25 LAB — VITAMIN D 25 HYDROXY (VIT D DEFICIENCY, FRACTURES): Vit D, 25-Hydroxy: 98 ng/mL (ref 30–100)

## 2018-12-25 LAB — PSA: PSA: 3.2 ng/mL (ref <=4.0)

## 2018-12-25 NOTE — Telephone Encounter (Signed)
I forgot to add that I also discussed the patient's lipid panel results.  We discussed risks and benefits versus statin therapy especially in a male of his age.  He would like to hold off on statin therapy trying to control his cholesterol with lifestyle changes.

## 2018-12-25 NOTE — Telephone Encounter (Signed)
I did call this patient today to discuss his blood work results.  I also consulted with Dr. Anastasio Champion regarding them.  It was recommended that the patient have PSA total and free percentage checked for further evaluation of PSA above 3 in the presence of LUTS.  The patient is agreeable to this and has been scheduled for follow-up lab draw next week.

## 2018-12-28 ENCOUNTER — Other Ambulatory Visit: Payer: Self-pay

## 2018-12-28 ENCOUNTER — Other Ambulatory Visit (INDEPENDENT_AMBULATORY_CARE_PROVIDER_SITE_OTHER): Payer: Self-pay | Admitting: Nurse Practitioner

## 2018-12-28 ENCOUNTER — Other Ambulatory Visit (INDEPENDENT_AMBULATORY_CARE_PROVIDER_SITE_OTHER): Payer: Self-pay | Admitting: Internal Medicine

## 2018-12-28 ENCOUNTER — Other Ambulatory Visit (INDEPENDENT_AMBULATORY_CARE_PROVIDER_SITE_OTHER): Payer: Medicare Other

## 2018-12-28 DIAGNOSIS — R39198 Other difficulties with micturition: Secondary | ICD-10-CM

## 2018-12-28 DIAGNOSIS — Z125 Encounter for screening for malignant neoplasm of prostate: Secondary | ICD-10-CM

## 2018-12-28 DIAGNOSIS — R351 Nocturia: Secondary | ICD-10-CM

## 2018-12-28 DIAGNOSIS — R972 Elevated prostate specific antigen [PSA]: Secondary | ICD-10-CM

## 2018-12-29 ENCOUNTER — Encounter: Payer: Self-pay | Admitting: Internal Medicine

## 2018-12-29 LAB — PSA, TOTAL AND FREE
PSA, % Free: 26 % (calc) (ref >25)
PSA, Free: 1 ng/mL
PSA, Total: 3.9 ng/mL (ref <=4.0)

## 2019-01-04 ENCOUNTER — Telehealth (INDEPENDENT_AMBULATORY_CARE_PROVIDER_SITE_OTHER): Payer: Self-pay | Admitting: Nurse Practitioner

## 2019-01-04 NOTE — Telephone Encounter (Signed)
I called this patient today to discuss his PSA total and free values.  I did tell him that his results indicate possible 8% risk of prostate cancer.  I also discussed that this is not a replacement for prostate biopsy.  This patient is not interested in undergoing biopsy, and does not want referral to urology.  I also discussed possible medications that he can take to treat his LUTS symptoms.  He tells me he would rather not take medication and less it is medically necessary.  For now we will hold off on referral to urology as well as starting medication.  He is encouraged to notify me if he would like referral to urology or if he has any other questions or concerns.  He told me he understood.

## 2019-01-26 DIAGNOSIS — H25011 Cortical age-related cataract, right eye: Secondary | ICD-10-CM | POA: Diagnosis not present

## 2019-01-26 LAB — HM DIABETES EYE EXAM

## 2019-01-27 ENCOUNTER — Telehealth (INDEPENDENT_AMBULATORY_CARE_PROVIDER_SITE_OTHER): Payer: Self-pay

## 2019-01-27 NOTE — Telephone Encounter (Signed)
Make an appointment for him to be seen either tomorrow by me(if schedule allows) or by Judson Roch next week.

## 2019-01-27 NOTE — Telephone Encounter (Signed)
4 days ago had a low blood sugar spell. Pt felt dizzines, loss balance,sweting nausea to stomach. Made it to be lay down. Take some orange juice and it was around 8AM. Wants to know what would you like to do at this time. Blood work he is willing to come do until he can see him.

## 2019-01-28 NOTE — Telephone Encounter (Signed)
Appt is set for 1/25 @ noon. ;he did not want to take earlier appt with sarah.

## 2019-02-01 ENCOUNTER — Encounter: Payer: Self-pay | Admitting: Nurse Practitioner

## 2019-02-01 ENCOUNTER — Other Ambulatory Visit: Payer: Self-pay

## 2019-02-01 ENCOUNTER — Ambulatory Visit (INDEPENDENT_AMBULATORY_CARE_PROVIDER_SITE_OTHER): Payer: Medicare Other | Admitting: Nurse Practitioner

## 2019-02-01 DIAGNOSIS — Z860100 Personal history of colon polyps, unspecified: Secondary | ICD-10-CM | POA: Insufficient documentation

## 2019-02-01 NOTE — H&P (View-Only) (Signed)
Primary Care Physician:  Doree Albee, MD Primary Gastroenterologist:  Dr. Gala Romney  Chief Complaint  Patient presents with  . Consult    TCS last done 2015    HPI:   Edward Williamson is a 74 y.o. male who presents on referral from primary care for scheduling of colonoscopy.  Nurse/phone triage was deferred office visit due to age.  Reviewed information associated with referral including office visit 12/24/2018 for follow-up of chronic conditions.  At that time labs were ordered and recommended referral to GI for colon cancer screening.  No history of colonoscopy or endoscopy in our system.  Today he states he is doing okay overall.  Indicates last colonoscopy completed in 2015 at [facility] in [place], [place]  by Dr. Zebedee Iba.  He brought the colonoscopy report with him today. Findings included three 4-7 mm polyps in the mid-transverse, hepatic flexure, and mid-ascending colons. He said the path report was benign.  Recommended 5 year repeat. Denies abdominal pain recently (has occasional gallbladder attack, last 8 months ago; HIDA 2014 with EF 10-20%). Denies N/V, hematochezia, melena, fever, chills, unintentional weight loss.Denies URI or flu-like symptoms. Denies loss of sense of taste or smell. Denies chest pain, dyspnea, dizziness, lightheadedness, syncope, near syncope. Denies any other upper or lower GI symptoms.  Past Medical History:  Diagnosis Date  . Erectile dysfunction   . Fatty liver   . 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 Gout   . Hyperlipidemia   . Tick bite   . Vitamin D deficiency   . Vitamin D deficiency 12/24/2018    Past Surgical History:  Procedure Laterality Date  . COLONOSCOPY W/ BIOPSIES  2015   Polyps x 3, no path available; reccomended repeat 5 years (2020)    Current Outpatient Medications  Medication Sig Dispense Refill  . allopurinol (ZYLOPRIM) 300 MG tablet Take 300 mg by mouth daily.    . CHOLECALCIFEROL PO Vit d3 + k2    . NON  FORMULARY 1 tablet. Alpha CRS+, xEO Mega, Microplex,VMz    . vitamin C (ASCORBIC ACID) 500 MG tablet Take 500 mg by mouth daily.    . Zinc 100 MG TABS Take 1 tablet by mouth.     No current facility-administered medications for this visit.    Allergies as of 02/01/2019  . (No Known Allergies)    Family History  Problem Relation Age of Onset  . Scoliosis Mother   . Heart attack Father   . Heart disease Father   . Heart disease Brother   . Heart attack Maternal Grandmother   . Depression Paternal Grandfather        suicide  . Colon cancer Neg Hx     Social History   Socioeconomic History  . Marital status: Divorced    Spouse name: Not on file  . Number of children: 2  . Years of education: 16 years  . Highest education level: Not on file  Occupational History  . Occupation: Retired  . Occupation: Curator  Tobacco Use  . Smoking status: Never Smoker  . Smokeless tobacco: Never Used  Substance and Sexual Activity  . Alcohol use: Yes    Alcohol/week: 1.0 standard drinks    Types: 1 Cans of beer per week    Comment: once per week in summer; cocktail (1) every 1-2 weeks  . Drug use: Never  . Sexual activity: Not on file  Other Topics Concern  .  Not on file  Social History Narrative  . Not on file   Social Determinants of Health   Financial Resource Strain:   . Difficulty of Paying Living Expenses: Not on file  Food Insecurity:   . Worried About Charity fundraiser in the Last Year: Not on file  . Ran Out of Food in the Last Year: Not on file  Transportation Needs:   . Lack of Transportation (Medical): Not on file  . Lack of Transportation (Non-Medical): Not on file  Physical Activity:   . Days of Exercise per Week: Not on file  . Minutes of Exercise per Session: Not on file  Stress:   . Feeling of Stress : Not on file  Social Connections:   . Frequency of Communication with Friends and Family: Not on file  . Frequency of Social  Gatherings with Friends and Family: Not on file  . Attends Religious Services: Not on file  . Active Member of Clubs or Organizations: Not on file  . Attends Archivist Meetings: Not on file  . Marital Status: Not on file  Intimate Partner Violence:   . Fear of Current or Ex-Partner: Not on file  . Emotionally Abused: Not on file  . Physically Abused: Not on file  . Sexually Abused: Not on file    Review of Systems: General: Negative for anorexia, weight loss, fever, chills, fatigue, weakness. ENT: Negative for hoarseness, difficulty swallowing. CV: Negative for chest pain, angina, palpitations, peripheral edema.  Respiratory: Negative for dyspnea at rest, cough, sputum, wheezing.  GI: See history of present illness.  MS: Negative for joint pain, low back pain.  Derm: Negative for rash or itching.  Endo: Negative for unusual weight change.  Heme: Negative for bruising or bleeding. Allergy: Negative for rash or hives.    Physical Exam: BP (!) 151/84   Pulse (!) 58   Temp (!) 97 F (36.1 C) (Oral)   Ht 5\' 11"  (1.803 m)   Wt 190 lb 6.4 oz (86.4 kg)   BMI 26.56 kg/m  General:   Alert and oriented. Pleasant and cooperative. Well-nourished and well-developed. Appears younger than stated age. Head:  Normocephalic and atraumatic. Eyes:  Without icterus, sclera clear and conjunctiva pink.  Ears:  Normal auditory acuity. Cardiovascular:  S1, S2 present without murmurs appreciated. Extremities without clubbing or edema. Respiratory:  Clear to auscultation bilaterally. No wheezes, rales, or rhonchi. No distress.  Gastrointestinal:  +BS, soft, non-tender and non-distended. No HSM noted. No guarding or rebound. No masses appreciated.  Rectal:  Deferred  Musculoskalatal:  Symmetrical without gross deformities. Neurologic:  Alert and oriented x4;  grossly normal neurologically. Psych:  Alert and cooperative. Normal mood and affect. Heme/Lymph/Immune: No excessive bruising  noted.    02/01/2019 10:19 AM   Disclaimer: This note was dictated with voice recognition software. Similar sounding words can inadvertently be transcribed and may not be corrected upon review.

## 2019-02-01 NOTE — Patient Instructions (Addendum)
Your health issues we discussed today were:   Need for colonoscopy: 1. Thank you for bringing your previous records with you today.  We will ensure this gets entered into our system to be available for providers in the [facility] system 2. We will schedule your colonoscopy for you 3. Further recommendations will follow your colonoscopy 4. Call us if you have any problems or significant bleeding noted.  Overall I recommend:  1. Continue your current medications 2. Return for follow-up based on recommendations made after your procedure 3. Call us if you have any questions or concerns.  ---------------------------------------------------------------  COVID-19 Vaccine Information can be found at: [email or web address].uk For questions related to vaccine distribution or appointments, please email [email or web address] or call [phone].   ---------------------------------------------------------------  At [facility] we value your feedback. You may receive a survey about your visit today. Please share your experience as we strive to create trusting relationships with our patients to provide genuine, compassionate, quality care.  We appreciate your understanding and patience as we review any laboratory studies, imaging, and other diagnostic tests that are ordered as we care for you. Our office policy is 5 business days for review of these results, and any emergent or urgent results are addressed in a timely manner for your best interest. If you do not hear from our office in 1 week, please contact us.   We also encourage the use of MyChart, which contains your medical information for your review as well. If you are not enrolled in this feature, an access code is on this after visit summary for your convenience. Thank you for allowing [place] to be involved in your care.  It was great to see you today!  I hope you have a Happy New  Year!!

## 2019-02-01 NOTE — Progress Notes (Signed)
Cc'ed to pcp °

## 2019-02-01 NOTE — Progress Notes (Signed)
Primary Care Physician:  Doree Albee, MD Primary Gastroenterologist:  Dr. Gala Romney  Chief Complaint  Patient presents with  . Consult    TCS last done 2015    HPI:   Edward Williamson is a 74 y.o. male who presents on referral from primary care for scheduling of colonoscopy.  Nurse/phone triage was deferred office visit due to age.  Reviewed information associated with referral including office visit 12/24/2018 for follow-up of chronic conditions.  At that time labs were ordered and recommended referral to GI for colon cancer screening.  No history of colonoscopy or endoscopy in our system.  Today he states he is doing okay overall.  Indicates last colonoscopy completed in 2015 at [facility] in [place], [place]  by Dr. Zebedee Iba.  He brought the colonoscopy report with him today. Findings included three 4-7 mm polyps in the mid-transverse, hepatic flexure, and mid-ascending colons. He said the path report was benign.  Recommended 5 year repeat. Denies abdominal pain recently (has occasional gallbladder attack, last 8 months ago; HIDA 2014 with EF 10-20%). Denies N/V, hematochezia, melena, fever, chills, unintentional weight loss.Denies URI or flu-like symptoms. Denies loss of sense of taste or smell. Denies chest pain, dyspnea, dizziness, lightheadedness, syncope, near syncope. Denies any other upper or lower GI symptoms.  Past Medical History:  Diagnosis Date  . Erectile dysfunction   . Fatty liver   . 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 Gout   . Hyperlipidemia   . Tick bite   . Vitamin D deficiency   . Vitamin D deficiency 12/24/2018    Past Surgical History:  Procedure Laterality Date  . COLONOSCOPY W/ BIOPSIES  2015   Polyps x 3, no path available; reccomended repeat 5 years (2020)    Current Outpatient Medications  Medication Sig Dispense Refill  . allopurinol (ZYLOPRIM) 300 MG tablet Take 300 mg by mouth daily.    . CHOLECALCIFEROL PO Vit d3 + k2    . NON  FORMULARY 1 tablet. Alpha CRS+, xEO Mega, Microplex,VMz    . vitamin C (ASCORBIC ACID) 500 MG tablet Take 500 mg by mouth daily.    . Zinc 100 MG TABS Take 1 tablet by mouth.     No current facility-administered medications for this visit.    Allergies as of 02/01/2019  . (No Known Allergies)    Family History  Problem Relation Age of Onset  . Scoliosis Mother   . Heart attack Father   . Heart disease Father   . Heart disease Brother   . Heart attack Maternal Grandmother   . Depression Paternal Grandfather        suicide  . Colon cancer Neg Hx     Social History   Socioeconomic History  . Marital status: Divorced    Spouse name: Not on file  . Number of children: 2  . Years of education: 16 years  . Highest education level: Not on file  Occupational History  . Occupation: Retired  . Occupation: Curator  Tobacco Use  . Smoking status: Never Smoker  . Smokeless tobacco: Never Used  Substance and Sexual Activity  . Alcohol use: Yes    Alcohol/week: 1.0 standard drinks    Types: 1 Cans of beer per week    Comment: once per week in summer; cocktail (1) every 1-2 weeks  . Drug use: Never  . Sexual activity: Not on file  Other Topics Concern  .  Not on file  Social History Narrative  . Not on file   Social Determinants of Health   Financial Resource Strain:   . Difficulty of Paying Living Expenses: Not on file  Food Insecurity:   . Worried About Charity fundraiser in the Last Year: Not on file  . Ran Out of Food in the Last Year: Not on file  Transportation Needs:   . Lack of Transportation (Medical): Not on file  . Lack of Transportation (Non-Medical): Not on file  Physical Activity:   . Days of Exercise per Week: Not on file  . Minutes of Exercise per Session: Not on file  Stress:   . Feeling of Stress : Not on file  Social Connections:   . Frequency of Communication with Friends and Family: Not on file  . Frequency of Social  Gatherings with Friends and Family: Not on file  . Attends Religious Services: Not on file  . Active Member of Clubs or Organizations: Not on file  . Attends Archivist Meetings: Not on file  . Marital Status: Not on file  Intimate Partner Violence:   . Fear of Current or Ex-Partner: Not on file  . Emotionally Abused: Not on file  . Physically Abused: Not on file  . Sexually Abused: Not on file    Review of Systems: General: Negative for anorexia, weight loss, fever, chills, fatigue, weakness. ENT: Negative for hoarseness, difficulty swallowing. CV: Negative for chest pain, angina, palpitations, peripheral edema.  Respiratory: Negative for dyspnea at rest, cough, sputum, wheezing.  GI: See history of present illness.  MS: Negative for joint pain, low back pain.  Derm: Negative for rash or itching.  Endo: Negative for unusual weight change.  Heme: Negative for bruising or bleeding. Allergy: Negative for rash or hives.    Physical Exam: BP (!) 151/84   Pulse (!) 58   Temp (!) 97 F (36.1 C) (Oral)   Ht 5\' 11"  (1.803 m)   Wt 190 lb 6.4 oz (86.4 kg)   BMI 26.56 kg/m  General:   Alert and oriented. Pleasant and cooperative. Well-nourished and well-developed. Appears younger than stated age. Head:  Normocephalic and atraumatic. Eyes:  Without icterus, sclera clear and conjunctiva pink.  Ears:  Normal auditory acuity. Cardiovascular:  S1, S2 present without murmurs appreciated. Extremities without clubbing or edema. Respiratory:  Clear to auscultation bilaterally. No wheezes, rales, or rhonchi. No distress.  Gastrointestinal:  +BS, soft, non-tender and non-distended. No HSM noted. No guarding or rebound. No masses appreciated.  Rectal:  Deferred  Musculoskalatal:  Symmetrical without gross deformities. Neurologic:  Alert and oriented x4;  grossly normal neurologically. Psych:  Alert and cooperative. Normal mood and affect. Heme/Lymph/Immune: No excessive bruising  noted.    02/01/2019 10:19 AM   Disclaimer: This note was dictated with voice recognition software. Similar sounding words can inadvertently be transcribed and may not be corrected upon review.

## 2019-02-01 NOTE — Assessment & Plan Note (Signed)
The patient is a high risk for colon cancer due to previous history of polyps.  His last colonoscopy was in 2015.  He brought the colonoscopy report with him which will be for understanding.  Findings included a total of three 4-7 mm polyps.  Pathology report not available to but the patient states he was told they were "benign".  No family history of colon cancer.  Recommended 5-year repeat exam.  He presents today approximately 1 year overdue for colonoscopy. He appears to be younger than his stated age, appears to be in excellent health.  We will proceed with colonoscopy at this time.  I discussed with him likely delay in procedure due to COVID-19 pandemic and he said this is no problem.  Further recommendations to follow colonoscopy.  Previous records marked for scanning.  Proceed with TCS with Dr. Gala Romney in near future: the risks, benefits, and alternatives have been discussed with the patient in detail. The patient states understanding and desires to proceed.  The patient is not on any anticoagulants, anxiolytics, chronic pain medications, antidepressants, antidiabetics, or iron supplements.  Denies alcohol and drug use.  Conscious sedation should be adequate for his procedure.

## 2019-02-08 ENCOUNTER — Encounter (INDEPENDENT_AMBULATORY_CARE_PROVIDER_SITE_OTHER): Payer: Self-pay | Admitting: Internal Medicine

## 2019-02-08 ENCOUNTER — Ambulatory Visit (INDEPENDENT_AMBULATORY_CARE_PROVIDER_SITE_OTHER): Payer: Medicare Other | Admitting: Internal Medicine

## 2019-02-08 ENCOUNTER — Other Ambulatory Visit: Payer: Self-pay

## 2019-02-08 ENCOUNTER — Other Ambulatory Visit (INDEPENDENT_AMBULATORY_CARE_PROVIDER_SITE_OTHER): Payer: Self-pay

## 2019-02-08 VITALS — BP 145/89 | HR 65 | Temp 97.6°F | Resp 18 | Ht 71.0 in | Wt 185.0 lb

## 2019-02-08 DIAGNOSIS — R42 Dizziness and giddiness: Secondary | ICD-10-CM

## 2019-02-08 NOTE — Progress Notes (Signed)
Metrics: Intervention Frequency ACO  Documented Smoking Status Yearly  Screened one or more times in 24 months  Cessation Counseling or  Active cessation medication Past 24 months  Past 24 months   Guideline developer: UpToDate (See UpToDate for funding source) Date Released: 2014       Wellness Office Visit  Subjective:  Patient ID: Edward Williamson, male    DOB: Apr 07, 1945  Age: 74 y.o. MRN: [id number]  CC: This man comes in for an acute visit with chief complaint of dizziness. HPI Just over 2 weeks ago, he had an episode in the morning where he suddenly became dizzy.  He felt he was going to lose consciousness.  This was exacerbated when he tried to stand up from a sitting position.  The whole episode lasted 2 to 3 hours before he felt back to normal.  He denied any movement of his surroundings around him.  He thinks this was triggered by just some eye movement or head movement.  He has had similar episodes in the past with movement of his head.  On no occasion has he lost consciousness. He denied any chest pain or palpitations with this episode.  Past Medical History:  Diagnosis Date  . Erectile dysfunction   . Fatty liver   . 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 Gout   . Hyperlipidemia   . Tick bite   . Vitamin D deficiency   . Vitamin D deficiency 12/24/2018      Family History  Problem Relation Age of Onset  . Scoliosis Mother   . Heart attack Father   . Heart disease Father   . Heart disease Brother   . Heart attack Maternal Grandmother   . Depression Paternal Grandfather        suicide  . Colon cancer Neg Hx     Social History   Social History Narrative  . Not on file   Social History   Tobacco Use  . Smoking status: Never Smoker  . Smokeless tobacco: Never Used  Substance Use Topics  . Alcohol use: Yes    Alcohol/week: 1.0 standard drinks    Types: 1 Cans of beer per week    Comment: once per week in summer; cocktail (1) every 1-2 weeks     Current Meds  Medication Sig  . allopurinol (ZYLOPRIM) 300 MG tablet Take 300 mg by mouth daily.  . CHOLECALCIFEROL PO Vit d3 + k2  . NON FORMULARY 1 tablet. Alpha CRS+, xEO Mega, Microplex,VMz  . vitamin C (ASCORBIC ACID) 500 MG tablet Take 500 mg by mouth daily.  . Zinc 100 MG TABS Take 1 tablet by mouth.      Objective:   Today's Vitals: BP (!) 145/89 (BP Location: Right Arm, Patient Position: Sitting, Cuff Size: Normal)   Pulse 65   Temp 97.6 F (36.4 C) (Temporal)   Resp 18   Ht 5\' 11"  (1.803 m)   Wt 185 lb (83.9 kg)   BMI 25.80 kg/m  Vitals with BMI 02/08/2019 02/01/2019 12/24/2018  Height 5\' 11"  5\' 11"  5\' 11"   Weight 185 lbs 190 lbs 6 oz 187 lbs 10 oz  BMI 25.81 123XX123 [id number]  Systolic Q000111Q 123XX123 Q000111Q  Diastolic 89 84 74  Pulse 65 58 50     Physical Exam  He looks systemically well.  He is alert and orientated and there are no obvious focal neurological signs.     Assessment   1. Vertigo  Tests ordered Orders Placed This Encounter  Procedures  . Ambulatory referral to ENT     Plan: 1. I think he may have benign positional vertigo based on his symptoms.  I will send him to ENT for further evaluation.   No orders of the defined types were placed in this encounter.   Doree Albee, MD

## 2019-02-10 ENCOUNTER — Telehealth: Payer: Self-pay

## 2019-02-10 NOTE — Telephone Encounter (Signed)
Called pt, TCS w/RMR scheduled for 02/17/19 at 8:30am. COVID test 02/15/19 at 9:00am. Pt will come by office 02/15/19 to pick up sample prep and instructions. He is aware he will start clear liquids after lunch 02/15/19.

## 2019-02-15 ENCOUNTER — Other Ambulatory Visit: Payer: Self-pay

## 2019-02-15 ENCOUNTER — Other Ambulatory Visit (HOSPITAL_COMMUNITY)
Admission: RE | Admit: 2019-02-15 | Discharge: 2019-02-15 | Disposition: A | Discharge status: Home / Self Care | Payer: Medicare Other | Source: Ambulatory Visit | Attending: Internal Medicine | Admitting: Internal Medicine

## 2019-02-15 DIAGNOSIS — Z20822 Contact with and (suspected) exposure to covid-19: Secondary | ICD-10-CM | POA: Insufficient documentation

## 2019-02-15 DIAGNOSIS — Z01812 Encounter for preprocedural laboratory examination: Secondary | ICD-10-CM | POA: Insufficient documentation

## 2019-02-15 LAB — SARS CORONAVIRUS 2 (TAT 6-24 HRS): SARS Coronavirus 2: NEGATIVE

## 2019-02-17 ENCOUNTER — Other Ambulatory Visit: Payer: Self-pay

## 2019-02-17 ENCOUNTER — Encounter (HOSPITAL_COMMUNITY)
Admission: RE | Disposition: A | Discharge status: Home / Self Care | Payer: Self-pay | Source: Home / Self Care | Attending: Internal Medicine

## 2019-02-17 ENCOUNTER — Encounter (HOSPITAL_COMMUNITY): Payer: Self-pay | Admitting: Internal Medicine

## 2019-02-17 ENCOUNTER — Ambulatory Visit (HOSPITAL_COMMUNITY)
Admission: RE | Admit: 2019-02-17 | Discharge: 2019-02-17 | Disposition: A | Discharge status: Home / Self Care | Payer: Medicare Other | Attending: Internal Medicine | Admitting: Internal Medicine

## 2019-02-17 DIAGNOSIS — Z79899 Other long term (current) drug therapy: Secondary | ICD-10-CM | POA: Insufficient documentation

## 2019-02-17 DIAGNOSIS — Z8269 Family history of other diseases of the musculoskeletal system and connective tissue: Secondary | ICD-10-CM | POA: Insufficient documentation

## 2019-02-17 DIAGNOSIS — Z818 Family history of other mental and behavioral disorders: Secondary | ICD-10-CM | POA: Diagnosis not present

## 2019-02-17 DIAGNOSIS — K760 Fatty (change of) liver, not elsewhere classified: Secondary | ICD-10-CM | POA: Diagnosis not present

## 2019-02-17 DIAGNOSIS — Z86010 Personal history of colonic polyps: Secondary | ICD-10-CM | POA: Diagnosis not present

## 2019-02-17 DIAGNOSIS — E559 Vitamin D deficiency, unspecified: Secondary | ICD-10-CM | POA: Insufficient documentation

## 2019-02-17 DIAGNOSIS — Z8249 Family history of ischemic heart disease and other diseases of the circulatory system: Secondary | ICD-10-CM | POA: Diagnosis not present

## 2019-02-17 DIAGNOSIS — K640 First degree hemorrhoids: Secondary | ICD-10-CM | POA: Diagnosis not present

## 2019-02-17 DIAGNOSIS — K5730 Diverticulosis of large intestine without perforation or abscess without bleeding: Secondary | ICD-10-CM | POA: Insufficient documentation

## 2019-02-17 DIAGNOSIS — Z1211 Encounter for screening for malignant neoplasm of colon: Secondary | ICD-10-CM | POA: Diagnosis not present

## 2019-02-17 DIAGNOSIS — E785 Hyperlipidemia, unspecified: Secondary | ICD-10-CM | POA: Insufficient documentation

## 2019-02-17 DIAGNOSIS — M109 Gout, unspecified: Secondary | ICD-10-CM | POA: Diagnosis not present

## 2019-02-17 HISTORY — PX: COLONOSCOPY: SHX5424

## 2019-02-17 SURGERY — COLONOSCOPY
Anesthesia: Moderate Sedation

## 2019-02-17 MED ORDER — MEPERIDINE HCL 50 MG/ML IJ SOLN
INTRAMUSCULAR | Status: AC
  Filled 2019-02-17: qty 1

## 2019-02-17 MED ORDER — ONDANSETRON HCL 4 MG/2ML IJ SOLN
INTRAMUSCULAR | Status: AC
  Filled 2019-02-17: qty 2

## 2019-02-17 MED ORDER — STERILE WATER FOR IRRIGATION IR SOLN: Status: DC | PRN

## 2019-02-17 MED ORDER — ONDANSETRON HCL 4 MG/2ML IJ SOLN
INTRAMUSCULAR | Status: DC | PRN
  Administered 2019-02-17: 4 mg via INTRAVENOUS

## 2019-02-17 MED ORDER — SODIUM CHLORIDE 0.9 % IV SOLN: INTRAVENOUS | Status: DC

## 2019-02-17 MED ORDER — MIDAZOLAM HCL 5 MG/5ML IJ SOLN
INTRAMUSCULAR | Status: AC
  Filled 2019-02-17: qty 5

## 2019-02-17 MED ORDER — MIDAZOLAM HCL 5 MG/5ML IJ SOLN
INTRAMUSCULAR | Status: DC | PRN
  Administered 2019-02-17: 1 mg via INTRAVENOUS
  Administered 2019-02-17: 2 mg via INTRAVENOUS
  Administered 2019-02-17: 1 mg via INTRAVENOUS

## 2019-02-17 MED ORDER — MEPERIDINE HCL 100 MG/ML IJ SOLN
INTRAMUSCULAR | Status: DC | PRN
  Administered 2019-02-17: 25 mg
  Administered 2019-02-17: 15 mg via INTRAVENOUS

## 2019-02-17 NOTE — Interval H&P Note (Signed)
History and Physical Interval Note:  02/17/2019 8:36 AM  Edward Williamson  has presented today for surgery, with the diagnosis of history of polyps.  The various methods of treatment have been discussed with the patient and family. After consideration of risks, benefits and other options for treatment, the patient has consented to  Procedure(s) with comments: COLONOSCOPY (N/A) - 8:30am as a surgical intervention.  The patient's history has been reviewed, patient examined, no change in status, stable for surgery.  I have reviewed the patient's chart and labs.  Questions were answered to the patient's satisfaction.     Robert Rourk  No change.  Surveillance colonoscopy today per plan.  The risks, benefits, limitations, alternatives and imponderables have been reviewed with the patient. Questions have been answered. All parties are agreeable.

## 2019-02-17 NOTE — Op Note (Signed)
[facility] Patient Name: Edward Williamson Procedure Date: 02/17/2019 7:04 AM MRN: [id number] Date of Birth: [birth year] Attending MD: Norvel Richards , MD CSN: [id number] Age: 74 Admit Type: Outpatient Procedure:                Colonoscopy Indications:              High risk colon cancer surveillance: Personal                            history of colonic polyps Providers:                Norvel Richards, MD, Janeece Riggers, RN, Aram Candela Referring MD:              Medicines:                Propofol per Anesthesia Complications:            No immediate complications. Estimated Blood Loss:     Estimated blood loss: none. Procedure:                Pre-Anesthesia Assessment:                           - Prior to the procedure, a History and Physical                            was performed, and patient medications and                            allergies were reviewed. The patient's tolerance of                            previous anesthesia was also reviewed. The risks                            and benefits of the procedure and the sedation                            options and risks were discussed with the patient.                            All questions were answered, and informed consent                            was obtained. Prior Anticoagulants: The patient has                            taken no previous anticoagulant or antiplatelet                            agents. ASA Grade Assessment: II - A patient with  mild systemic disease. After reviewing the risks                            and benefits, the patient was deemed in                            satisfactory condition to undergo the procedure.                           After obtaining informed consent, the colonoscope                            was passed under direct vision. Throughout the                            procedure, the patient's blood pressure,  pulse, and                            oxygen saturations were monitored continuously. The                            [id number] scope was introduced through                            the anus and advanced to the the cecum, identified                            by appendiceal orifice and ileocecal valve. The                            colonoscopy was performed without difficulty. The                            patient tolerated the procedure well. The quality                            of the bowel preparation was adequate. Scope In: 8:48:11 AM Scope Out: 9:00:17 AM Scope Withdrawal Time: 0 hours 6 minutes 46 seconds  Total Procedure Duration: 0 hours 12 minutes 6 seconds  Findings:      The perianal and digital rectal examinations were normal.      Non-bleeding internal hemorrhoids were found during retroflexion. The       hemorrhoids were mild, small and Grade I (internal hemorrhoids that do       not prolapse).      Scattered medium-mouthed diverticula were found in the sigmoid colon and       descending colon.      The exam was otherwise without abnormality on direct and retroflexion       views. Impression:               - Non-bleeding internal hemorrhoids.                           - Diverticulosis in the sigmoid colon and in the  descending colon.                           - The examination was otherwise normal on direct                            and retroflexion views.                           - No specimens collected. Bigeminy noted                            intermittently before, during and after the                            procedure today. Patient denies symptoms of                            palpitations, shortness of breath/ chest pain. No                            chronic cardiac disease. Rhythm strip well                            documents bigeminy which will be put into the                            medical record.  Twelve-lead EKG demonstrates sinus                            rhythm occasional PVC no acute changes. Patient                            notes no prior history of cardiac disease. Younger                            brother had triple bypass surgery. Father died of                            an MI at age 39.                           He looks absolutely fine in PACU and denies any                            symptoms referable to his heart whatsoever. At this                            time, I recommend he keep his upcoming follow-up                            appointment with Dr. Anastasio Champion. Will defer to him any  cardiac evaluation he deems appropriate. Moderate Sedation:      Moderate (conscious) sedation was administered by the endoscopy nurse       and supervised by the endoscopist. The following parameters were       monitored: oxygen saturation, heart rate, blood pressure, respiratory       rate, EKG, adequacy of pulmonary ventilation, and response to care.       Total physician intraservice time was 22 minutes. Recommendation:           - Patient has a contact number available for                            emergencies. The signs and symptoms of potential                            delayed complications were discussed with the                            patient. Return to normal activities tomorrow.                            Written discharge instructions were provided to the                            patient.                           - Advance diet as tolerated.                           - Continue present medications.                           - No repeat colonoscopy due to age.                           - Return to GI clinic PRN. See above. Procedure Code(s):        --- Professional ---                           [phone], Colonoscopy, flexible; diagnostic, including                            collection of specimen(s) by brushing or washing,                             when performed (separate procedure)                           G0500, Moderate sedation services provided by the                            same physician or other qualified health care                            professional performing a gastrointestinal  endoscopic service that sedation supports,                            requiring the presence of an independent trained                            observer to assist in the monitoring of the                            patient's level of consciousness and physiological                            status; initial 15 minutes of intra-service time;                            patient age 25 years or older (additional time may                            be reported with [phone], as appropriate) Diagnosis Code(s):        --- Professional ---                           Z86.010, Personal history of colonic polyps                           K64.0, First degree hemorrhoids                           K57.30, Diverticulosis of large intestine without                            perforation or abscess without bleeding CPT copyright 2019 American Medical Association. All rights reserved. The codes documented in this report are preliminary and upon coder review may  be revised to meet current compliance requirements. Cristopher Estimable. Gailya Tauer, MD Norvel Richards, MD 02/17/2019 9:48:39 AM This report has been signed electronically. Number of Addenda: 0

## 2019-02-17 NOTE — Discharge Instructions (Signed)
Colonoscopy Discharge Instructions  Read the instructions outlined below and refer to this sheet in the next few weeks. These discharge instructions provide you with general information on caring for yourself after you leave the hospital. Your doctor may also give you specific instructions. While your treatment has been planned according to the most current medical practices available, unavoidable complications occasionally occur. If you have any problems or questions after discharge, call Dr. Gala Romney at [phone]. ACTIVITY  You may resume your regular activity, but move at a slower pace for the next 24 hours.   Take frequent rest periods for the next 24 hours.   Walking will help get rid of the air and reduce the bloated feeling in your belly (abdomen).   No driving for 24 hours (because of the medicine (anesthesia) used during the test).    Do not sign any important legal documents or operate any machinery for 24 hours (because of the anesthesia used during the test).  NUTRITION  Drink plenty of fluids.   You may resume your normal diet as instructed by your doctor.   Begin with a light meal and progress to your normal diet. Heavy or fried foods are harder to digest and may make you feel sick to your stomach (nauseated).   Avoid alcoholic beverages for 24 hours or as instructed.  MEDICATIONS  You may resume your normal medications unless your doctor tells you otherwise.  WHAT YOU CAN EXPECT TODAY  Some feelings of bloating in the abdomen.   Passage of more gas than usual.   Spotting of blood in your stool or on the toilet paper.  IF YOU HAD POLYPS REMOVED DURING THE COLONOSCOPY:  No aspirin products for 7 days or as instructed.   No alcohol for 7 days or as instructed.   Eat a soft diet for the next 24 hours.  FINDING OUT THE RESULTS OF YOUR TEST Not all test results are available during your visit. If your test results are not back during the visit, make an appointment  with your caregiver to find out the results. Do not assume everything is normal if you have not heard from your caregiver or the medical facility. It is important for you to follow up on all of your test results.  SEEK IMMEDIATE MEDICAL ATTENTION IF:  You have more than a spotting of blood in your stool.   Your belly is swollen (abdominal distention).   You are nauseated or vomiting.   You have a temperature over 101.   You have abdominal pain or discomfort that is severe or gets worse throughout the day.   Diverticulosis information provided  No polyps found today  I do not recommend a future colonoscopy list new symptoms develop.  As discussed with you,  occasional irregular heartbeat during monitored during today.  I do recommend you follow-up with Dr. Anastasio Champion about this observation.  An EKG has been done.     Diverticulosis  Diverticulosis is a condition that develops when small pouches (diverticula) form in the wall of the large intestine (colon). The colon is where water is absorbed and stool (feces) is formed. The pouches form when the inside layer of the colon pushes through weak spots in the outer layers of the colon. You may have a few pouches or many of them. The pouches usually do not cause problems unless they become inflamed or infected. When this happens, the condition is called diverticulitis. What are the causes? The cause of this condition is not  known. What increases the risk? The following factors may make you more likely to develop this condition:  Being older than age 48. Your risk for this condition increases with age. Diverticulosis is rare among people younger than age 73. By age 38, many people have it.  Eating a low-fiber diet.  Having frequent constipation.  Being overweight.  Not getting enough exercise.  Smoking.  Taking over-the-counter pain medicines, like aspirin and ibuprofen.  Having a family history of diverticulosis. What are the  signs or symptoms? In most people, there are no symptoms of this condition. If you do have symptoms, they may include:  Bloating.  Cramps in the abdomen.  Constipation or diarrhea.  Pain in the lower left side of the abdomen. How is this diagnosed? Because diverticulosis usually has no symptoms, it is most often diagnosed during an exam for other colon problems. The condition may be diagnosed by:  Using a flexible scope to examine the colon (colonoscopy).  Taking an X-ray of the colon after dye has been put into the colon (barium enema).  Having a CT scan. How is this treated? You may not need treatment for this condition. Your health care provider may recommend treatment to prevent problems. You may need treatment if you have symptoms or if you previously had diverticulitis. Treatment may include:  Eating a high-fiber diet.  Taking a fiber supplement.  Taking a live bacteria supplement (probiotic).  Taking medicine to relax your colon. Follow these instructions at home: Medicines  Take over-the-counter and prescription medicines only as told by your health care provider.  If told by your health care provider, take a fiber supplement or probiotic. Constipation prevention Your condition may cause constipation. To prevent or treat constipation, you may need to:  Drink enough fluid to keep your urine pale yellow.  Take over-the-counter or prescription medicines.  Eat foods that are high in fiber, such as beans, whole grains, and fresh fruits and vegetables.  Limit foods that are high in fat and processed sugars, such as fried or sweet foods.  General instructions  Try not to strain when you have a bowel movement.  Keep all follow-up visits as told by your health care provider. This is important. Contact a health care provider if you:  Have pain in your abdomen.  Have bloating.  Have cramps.  Have not had a bowel movement in 3 days. Get help right away  if:  Your pain gets worse.  Your bloating becomes very bad.  You have a fever or chills, and your symptoms suddenly get worse.  You vomit.  You have bowel movements that are bloody or black.  You have bleeding from your rectum. Summary  Diverticulosis is a condition that develops when small pouches (diverticula) form in the wall of the large intestine (colon).  You may have a few pouches or many of them.  This condition is most often diagnosed during an exam for other colon problems.  Treatment may include increasing the fiber in your diet, taking supplements, or taking medicines. This information is not intended to replace advice given to you by your health care provider. Make sure you discuss any questions you have with your health care provider. Document Revised: 07/30/2018 Document Reviewed: 07/30/2018 Elsevier Patient Education  [place].    Colonoscopy, Adult, Care After This sheet gives you information about how to care for yourself after your procedure. Your doctor may also give you more specific instructions. If you have problems or questions, call  your doctor. What can I expect after the procedure? After the procedure, it is common to have:  A small amount of blood in your poop (stool) for 24 hours.  Some gas.  Mild cramping or bloating in your belly (abdomen). Follow these instructions at home: Eating and drinking   Drink enough fluid to keep your pee (urine) pale yellow.  Follow instructions from your doctor about what you cannot eat or drink.  Return to your normal diet as told by your doctor. Avoid heavy or fried foods that are hard to digest. Activity  Rest as told by your doctor.  Do not sit for a long time without moving. Get up to take short walks every 1-2 hours. This is important. Ask for help if you feel weak or unsteady.  Return to your normal activities as told by your doctor. Ask your doctor what activities are safe for you. To  help cramping and bloating:   Try walking around.  Put heat on your belly as told by your doctor. Use the heat source that your doctor recommends, such as a moist heat pack or a heating pad. ? Put a towel between your skin and the heat source. ? Leave the heat on for 20-30 minutes. ? Remove the heat if your skin turns bright red. This is very important if you are unable to feel pain, heat, or cold. You may have a greater risk of getting burned. General instructions  For the first 24 hours after the procedure: ? Do not drive or use machinery. ? Do not sign important documents. ? Do not drink alcohol. ? Do your daily activities more slowly than normal. ? Eat foods that are soft and easy to digest.  Take over-the-counter or prescription medicines only as told by your doctor.  Keep all follow-up visits as told by your doctor. This is important. Contact a doctor if:  You have blood in your poop 2-3 days after the procedure. Get help right away if:  You have more than a small amount of blood in your poop.  You see large clumps of tissue (blood clots) in your poop.  Your belly is swollen.  You feel like you may vomit (nauseous).  You vomit.  You have a fever.  You have belly pain that gets worse, and medicine does not help your pain. Summary  After the procedure, it is common to have a small amount of blood in your poop. You may also have mild cramping and bloating in your belly.  For the first 24 hours after the procedure, do not drive or use machinery, do not sign important documents, and do not drink alcohol.  Get help right away if you have a lot of blood in your poop, feel like you may vomit, have a fever, or have more belly pain. This information is not intended to replace advice given to you by your health care provider. Make sure you discuss any questions you have with your health care provider. Document Revised: 07/27/2018 Document Reviewed: 07/27/2018 Elsevier  Patient Education  [place].

## 2019-02-24 ENCOUNTER — Ambulatory Visit (INDEPENDENT_AMBULATORY_CARE_PROVIDER_SITE_OTHER): Payer: Medicare Other | Admitting: Otolaryngology

## 2019-02-24 ENCOUNTER — Other Ambulatory Visit: Payer: Self-pay

## 2019-02-24 ENCOUNTER — Encounter (INDEPENDENT_AMBULATORY_CARE_PROVIDER_SITE_OTHER): Payer: Self-pay | Admitting: Otolaryngology

## 2019-02-24 VITALS — Temp 97.3°F

## 2019-02-24 DIAGNOSIS — R42 Dizziness and giddiness: Secondary | ICD-10-CM | POA: Diagnosis not present

## 2019-02-24 NOTE — Progress Notes (Signed)
HPI: Edward Williamson is a 74 y.o. male who presents is referred by Dr. Anastasio Champion for evaluation of dizziness.  Patient apparently has had problems with intermittent dizziness for 5 to 6 years.  He has complained of some imbalance as well as some blurred vision is feels like his site is closing in.  He has seen ophthalmology with normal eye evaluation.  His last bad episode occurred 3 weeks ago where he was very dizzy and felt a pull to the left it took him 3 days to get over it.  He really does not describe true spinning or vertigo.  He has not noted any obvious hearing loss but does know he has some slight decreased hearing. He does not smoke.  Past Medical History:  Diagnosis Date  . Erectile dysfunction   . Fatty liver   . 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 Gout   . Hyperlipidemia   . Tick bite   . Vitamin D deficiency   . Vitamin D deficiency 12/24/2018   Past Surgical History:  Procedure Laterality Date  . COLONOSCOPY N/A 02/17/2019   Procedure: COLONOSCOPY;  Surgeon: Daneil Dolin, MD;  Location: [facility];  Service: Endoscopy;  Laterality: N/A;  8:30am  . COLONOSCOPY W/ BIOPSIES  2015   Polyps x 3, no path available; reccomended repeat 5 years (2020)   Social History   Socioeconomic History  . Marital status: Single    Spouse name: Not on file  . Number of children: 2  . Years of education: 16 years  . Highest education level: Not on file  Occupational History  . Occupation: Retired  . Occupation: Curator  Tobacco Use  . Smoking status: Never Smoker  . Smokeless tobacco: Never Used  Substance and Sexual Activity  . Alcohol use: Yes    Alcohol/week: 1.0 standard drinks    Types: 1 Cans of beer per week    Comment: once per week in summer; cocktail (1) every 1-2 weeks  . Drug use: Never  . Sexual activity: Not on file  Other Topics Concern  . Not on file  Social History Narrative  . Not on file   Social Determinants of Health    Financial Resource Strain:   . Difficulty of Paying Living Expenses: Not on file  Food Insecurity:   . Worried About Charity fundraiser in the Last Year: Not on file  . Ran Out of Food in the Last Year: Not on file  Transportation Needs:   . Lack of Transportation (Medical): Not on file  . Lack of Transportation (Non-Medical): Not on file  Physical Activity:   . Days of Exercise per Week: Not on file  . Minutes of Exercise per Session: Not on file  Stress:   . Feeling of Stress : Not on file  Social Connections:   . Frequency of Communication with Friends and Family: Not on file  . Frequency of Social Gatherings with Friends and Family: Not on file  . Attends Religious Services: Not on file  . Active Member of Clubs or Organizations: Not on file  . Attends Archivist Meetings: Not on file  . Marital Status: Not on file   Family History  Problem Relation Age of Onset  . Scoliosis Mother   . Heart attack Father   . Heart disease Father   . Heart disease Brother   . Heart attack Maternal Grandmother   . Depression Paternal Grandfather  suicide  . Colon cancer Neg Hx    No Known Allergies Prior to Admission medications   Medication Sig Start Date End Date Taking? Authorizing Provider  allopurinol (ZYLOPRIM) 300 MG tablet Take 300 mg by mouth daily in the afternoon.    Yes [provider]  Boswellia-Glucosamine-Vit D (OSTEO BI-FLEX ONE PER DAY PO) Take 1 tablet by mouth daily.   Yes [provider]  CANNABIDIOL PO Apply 1 application topically daily. Janace Aris   Yes [provider]  OVER THE COUNTER MEDICATION Take 1 packet by mouth daily. Doterra Lifelong Vitality Pack Alpha CRS+, Xeo Mega and Microplex VMZ   Yes [provider]  vitamin C (ASCORBIC ACID) 500 MG tablet Take 500 mg by mouth daily.   Yes [provider]  Vitamin D-Vitamin K (K2 PLUS D3 PO) Take 1 tablet by mouth every other day. In the morning    Yes [provider]  Zinc 100 MG TABS Take 1 tablet by mouth daily. UltraZin Zinc   Yes [provider]     Positive ROS: Otherwise negative no headaches.  All other systems have been reviewed and were otherwise negative with the exception of those mentioned in the HPI and as above.  Physical Exam: Constitutional: Alert, well-appearing, no acute distress Ears: External ears without lesions or tenderness. Ear canals are clear bilaterally with intact, clear TMs.  On hearing screening with a tuning forks he had slightly worse hearing in the right ear with AC > BC bilaterally.  On [employer] testing he had no obvious benign positional vertigo noted in the office. Nasal: External nose without lesions. Septum with mild deformity.. Clear nasal passages.  No signs of infection. Oral: Lips and gums without lesions. Tongue and palate mucosa without lesions. Posterior oropharynx clear. Neck: No palpable adenopathy or masses Respiratory: Breathing comfortably  Skin: No facial/neck lesions or rash noted.  Procedures  Assessment: Dizziness questionable etiology.  Overall symptoms are not consistent with BPPV as this should not cause any visual disturbance.  Plan: We have scheduled the patient for VNG testing and audiologic testing to evaluate inner ear function. Would also recommend evaluation with neurology as his overall symptoms are not consistent with inner ear problems.   Radene Journey, MD   CC:

## 2019-03-06 ENCOUNTER — Ambulatory Visit: Payer: Medicare Other | Attending: Internal Medicine

## 2019-03-06 DIAGNOSIS — Z23 Encounter for immunization: Secondary | ICD-10-CM | POA: Insufficient documentation

## 2019-03-06 NOTE — Progress Notes (Signed)
   Covid-19 Vaccination Clinic  Name:  Edward Williamson    MRN: [id number] DOB: May 24, 1945  03/06/2019  Edward Williamson was observed post Covid-19 immunization for 15 minutes without incidence. He was provided with Vaccine Information Sheet and instruction to access the V-Safe system.   Edward Williamson was instructed to call 911 with any severe reactions post vaccine: Marland Kitchen Difficulty breathing  . Swelling of your face and throat  . A fast heartbeat  . A bad rash all over your body  . Dizziness and weakness    Immunizations Administered    Name Date Dose VIS Date Route   Pfizer COVID-19 Vaccine 03/06/2019 11:34 AM 0.3 mL 12/25/2018 Intramuscular   Manufacturer: [place]   Lot: [id number]   [place]: [id number]

## 2019-03-09 DIAGNOSIS — R42 Dizziness and giddiness: Secondary | ICD-10-CM | POA: Diagnosis not present

## 2019-03-09 DIAGNOSIS — H903 Sensorineural hearing loss, bilateral: Secondary | ICD-10-CM | POA: Diagnosis not present

## 2019-03-16 ENCOUNTER — Other Ambulatory Visit (INDEPENDENT_AMBULATORY_CARE_PROVIDER_SITE_OTHER): Payer: Self-pay | Admitting: Internal Medicine

## 2019-03-17 ENCOUNTER — Ambulatory Visit (INDEPENDENT_AMBULATORY_CARE_PROVIDER_SITE_OTHER): Payer: Medicare Other | Admitting: Otolaryngology

## 2019-03-17 ENCOUNTER — Other Ambulatory Visit: Payer: Self-pay

## 2019-03-17 ENCOUNTER — Encounter (INDEPENDENT_AMBULATORY_CARE_PROVIDER_SITE_OTHER): Payer: Self-pay | Admitting: Otolaryngology

## 2019-03-17 VITALS — Temp 97.2°F

## 2019-03-17 DIAGNOSIS — R42 Dizziness and giddiness: Secondary | ICD-10-CM | POA: Diagnosis not present

## 2019-03-17 NOTE — Progress Notes (Signed)
HPI: Edward Williamson is a 74 y.o. male who returns today for evaluation of dizziness.  Patient returns today following VNG testing and audiologic testing.  He still has occasional dizziness.  He is not having any dizziness today.  But inquires about the VNG testing that seemed to induce symptoms on one side and did not seem to induce symptoms on the other side..  Past Medical History:  Diagnosis Date  . Erectile dysfunction   . Fatty liver   . 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 Gout   . Hyperlipidemia   . Tick bite   . Vitamin D deficiency   . Vitamin D deficiency 12/24/2018   Past Surgical History:  Procedure Laterality Date  . COLONOSCOPY N/A 02/17/2019   Procedure: COLONOSCOPY;  Surgeon: Daneil Dolin, MD;  Location: [facility];  Service: Endoscopy;  Laterality: N/A;  8:30am  . COLONOSCOPY W/ BIOPSIES  2015   Polyps x 3, no path available; reccomended repeat 5 years (2020)   Social History   Socioeconomic History  . Marital status: Single    Spouse name: Not on file  . Number of children: 2  . Years of education: 16 years  . Highest education level: Not on file  Occupational History  . Occupation: Retired  . Occupation: Curator  Tobacco Use  . Smoking status: Never Smoker  . Smokeless tobacco: Never Used  Substance and Sexual Activity  . Alcohol use: Yes    Alcohol/week: 1.0 standard drinks    Types: 1 Cans of beer per week    Comment: once per week in summer; cocktail (1) every 1-2 weeks  . Drug use: Never  . Sexual activity: Not on file  Other Topics Concern  . Not on file  Social History Narrative  . Not on file   Social Determinants of Health   Financial Resource Strain:   . Difficulty of Paying Living Expenses: Not on file  Food Insecurity:   . Worried About Charity fundraiser in the Last Year: Not on file  . Ran Out of Food in the Last Year: Not on file  Transportation Needs:   . Lack of Transportation (Medical): Not on file   . Lack of Transportation (Non-Medical): Not on file  Physical Activity:   . Days of Exercise per Week: Not on file  . Minutes of Exercise per Session: Not on file  Stress:   . Feeling of Stress : Not on file  Social Connections:   . Frequency of Communication with Friends and Family: Not on file  . Frequency of Social Gatherings with Friends and Family: Not on file  . Attends Religious Services: Not on file  . Active Member of Clubs or Organizations: Not on file  . Attends Archivist Meetings: Not on file  . Marital Status: Not on file   Family History  Problem Relation Age of Onset  . Scoliosis Mother   . Heart attack Father   . Heart disease Father   . Heart disease Brother   . Heart attack Maternal Grandmother   . Depression Paternal Grandfather        suicide  . Colon cancer Neg Hx    No Known Allergies Prior to Admission medications   Medication Sig Start Date End Date Taking? Authorizing Provider  allopurinol (ZYLOPRIM) 300 MG tablet Take 1 tablet by mouth once daily 03/16/19  Yes Ailene Ards, NP  Boswellia-Glucosamine-Vit D (OSTEO BI-FLEX ONE PER  DAY PO) Take 1 tablet by mouth daily.   Yes [provider]  CANNABIDIOL PO Apply 1 application topically daily. Janace Aris   Yes [provider]  OVER THE COUNTER MEDICATION Take 1 packet by mouth daily. Doterra Lifelong Vitality Pack Alpha CRS+, Xeo Mega and Microplex VMZ   Yes [provider]  vitamin C (ASCORBIC ACID) 500 MG tablet Take 500 mg by mouth daily.   Yes [provider]  Vitamin D-Vitamin K (K2 PLUS D3 PO) Take 1 tablet by mouth every other day. In the morning   Yes [provider]  Zinc 100 MG TABS Take 1 tablet by mouth daily. UltraZin Zinc   Yes [provider]     Positive ROS: Otherwise negative  All other systems have been reviewed and were otherwise negative with the exception of those mentioned in the HPI and as above.  Physical  Exam: Constitutional: Alert, well-appearing, no acute distress Ears: External ears without lesions or tenderness. Ear canals are clear bilaterally with intact, clear TMs bilaterally Nasal: External nose without lesions. Clear nasal passages Oral: Lips and gums without lesions. Tongue and palate mucosa without lesions. Posterior oropharynx clear. Neck: No palpable adenopathy or masses Respiratory: Breathing comfortably  Skin: No facial/neck lesions or rash noted.  I reviewed the audiogram with the patient in the office today this demonstrated symmetric downsloping sensorineural hearing loss in both ears with maximum SNHL at 4000 frequency of 60 DB.  His SRT's were 20 dB bilaterally.  He had type A tympanograms bilaterally.  He had good speech recognition bilaterally. VNG testing was normal bilaterally with no indication of BPPV and no evidence of vestibular weakness or vestibular abnormality on either side.  Procedures  Assessment: Episodes of dizziness questionable etiology. Normal vestibular function on VNG testing. Moderate downsloping SNHL in both ears which was symmetric.  Plan: Reviewed with patient today concerning no evidence of vestibular pathology causing dizziness. He would be a candidate for use of hearing aids if he is having difficulty with his hearing. Concerning his dizziness would recommend further evaluation with neurologist as he has no evidence of vestibular abnormality or inner ear abnormality contributing to his dizziness.   Radene Journey, MD

## 2019-03-19 ENCOUNTER — Encounter (INDEPENDENT_AMBULATORY_CARE_PROVIDER_SITE_OTHER): Payer: Self-pay

## 2019-03-31 ENCOUNTER — Ambulatory Visit: Payer: Medicare Other | Attending: Internal Medicine

## 2019-03-31 DIAGNOSIS — Z23 Encounter for immunization: Secondary | ICD-10-CM

## 2019-03-31 NOTE — Progress Notes (Signed)
   Covid-19 Vaccination Clinic  Name:  Edward Williamson    MRN: [id number] DOB: Apr 06, 1945  03/31/2019  [name] was observed post Covid-19 immunization for 15 minutes without incident. He was provided with Vaccine Information Sheet and instruction to access the V-Safe system.   [name] was instructed to call 911 with any severe reactions post vaccine: Marland Kitchen Difficulty breathing  . Swelling of face and throat  . A fast heartbeat  . A bad rash all over body  . Dizziness and weakness   Immunizations Administered    Name Date Dose VIS Date Route   Pfizer COVID-19 Vaccine 03/31/2019  8:16 AM 0.3 mL 12/25/2018 Intramuscular   Manufacturer: [place]   Lot: [id number]   [place]: [id number]

## 2019-04-05 ENCOUNTER — Ambulatory Visit (INDEPENDENT_AMBULATORY_CARE_PROVIDER_SITE_OTHER): Payer: Medicare Other | Admitting: Internal Medicine

## 2019-04-06 ENCOUNTER — Encounter (INDEPENDENT_AMBULATORY_CARE_PROVIDER_SITE_OTHER): Payer: Self-pay | Admitting: Internal Medicine

## 2019-04-06 ENCOUNTER — Ambulatory Visit (INDEPENDENT_AMBULATORY_CARE_PROVIDER_SITE_OTHER): Payer: Medicare Other | Admitting: Internal Medicine

## 2019-04-06 ENCOUNTER — Other Ambulatory Visit: Payer: Self-pay

## 2019-04-06 VITALS — BP 130/80 | HR 84 | Temp 97.9°F | Ht 71.0 in | Wt 193.2 lb

## 2019-04-06 DIAGNOSIS — R42 Dizziness and giddiness: Secondary | ICD-10-CM

## 2019-04-06 NOTE — Progress Notes (Signed)
Metrics: Intervention Frequency ACO  Documented Smoking Status Yearly  Screened one or more times in 24 months  Cessation Counseling or  Active cessation medication Past 24 months  Past 24 months   Guideline developer: UpToDate (See UpToDate for funding source) Date Released: 2014       Wellness Office Visit  Subjective:  Patient ID: Edward Williamson, male    DOB: 03/25/1945  Age: 74 y.o. MRN: [id number]  CC: This man comes in for follow-up regarding his dizziness. HPI  He has seen ENT and apparently testing was mostly negative.  The patient had many questions regarding the actual test.  He tells me that when warmed air was introduced to his left ear, he definitely got dizzy symptoms.  However the test was reported as normal.  He does not know with the this really means normal or not.  ENT has recommended referral to neurology.  Thankfully, he has not had any symptoms for the last 2 months now. Otherwise he feels well with no other symptoms. Past Medical History:  Diagnosis Date  . Erectile dysfunction   . Fatty liver   . 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 Gout   . Hyperlipidemia   . Tick bite   . Vitamin D deficiency   . Vitamin D deficiency 12/24/2018      Family History  Problem Relation Age of Onset  . Scoliosis Mother   . Heart attack Father   . Heart disease Father   . Heart disease Brother   . Heart attack Maternal Grandmother   . Depression Paternal Grandfather        suicide  . Colon cancer Neg Hx     Social History   Social History Narrative  . Not on file   Social History   Tobacco Use  . Smoking status: Never Smoker  . Smokeless tobacco: Never Used  Substance Use Topics  . Alcohol use: Yes    Alcohol/week: 1.0 standard drinks    Types: 1 Cans of beer per week    Comment: once per week in summer; cocktail (1) every 1-2 weeks    Current Meds  Medication Sig  . allopurinol (ZYLOPRIM) 300 MG tablet Take 1 tablet by mouth once daily  .  Boswellia-Glucosamine-Vit D (OSTEO BI-FLEX ONE PER DAY PO) Take 1 tablet by mouth daily.  Marland Kitchen CANNABIDIOL PO Apply 1 application topically daily. Janace Aris  . OVER THE COUNTER MEDICATION Take 1 packet by mouth daily. Doterra Lifelong Vitality Pack Alpha CRS+, Xeo Mega and Microplex VMZ  . vitamin C (ASCORBIC ACID) 500 MG tablet Take 500 mg by mouth daily.  . Vitamin D-Vitamin K (K2 PLUS D3 PO) Take 1 tablet by mouth every other day. In the morning  . Zinc 100 MG TABS Take 1 tablet by mouth daily. UltraZin Zinc       Objective:   Today's Vitals: BP 130/80 (BP Location: Left Arm, Patient Position: Sitting, Cuff Size: Normal)   Pulse 84   Temp 97.9 F (36.6 C) (Temporal)   Ht 5\' 11"  (1.803 m)   Wt 193 lb 3.2 oz (87.6 kg)   SpO2 96%   BMI 26.95 kg/m  Vitals with BMI 04/06/2019 02/17/2019 02/17/2019  Height 5\' 11"  - 5\' 11"   Weight 193 lbs 3 oz - (No Data)  BMI [id number] - -  Systolic [id number]  Diastolic 80 50 67  Pulse 84 72 83     Physical Exam  He look  systemically well.  No new physical findings today.     Assessment   1. Vertigo       Tests ordered No orders of the defined types were placed in this encounter.    Plan: 1. I will contact another ENT surgeon to further discuss the test itself and see if what he felt was abnormal or not.  I will let them know. 2. Otherwise, he is doing well and I do not see the need for neurology referral at this point since he has been asymptomatic from dizziness for the last 2 months or so. 3. Follow-up in about 6 months or earlier should the need arise. 4. I spent 30 minutes with this patient today discussing all his symptoms regarding the vertigo had reviewed the results of the test with him.   No orders of the defined types were placed in this encounter.   Doree Albee, MD

## 2019-04-12 ENCOUNTER — Telehealth (INDEPENDENT_AMBULATORY_CARE_PROVIDER_SITE_OTHER): Payer: Self-pay | Admitting: Internal Medicine

## 2019-04-12 ENCOUNTER — Other Ambulatory Visit (INDEPENDENT_AMBULATORY_CARE_PROVIDER_SITE_OTHER): Payer: Self-pay | Admitting: Internal Medicine

## 2019-04-12 DIAGNOSIS — Z1283 Encounter for screening for malignant neoplasm of skin: Secondary | ICD-10-CM

## 2019-04-12 NOTE — Telephone Encounter (Signed)
Can you please refer this patient to Dr. Denna Haggard in [place] for an annual skin check.  I will put the order in.  Thanks.

## 2019-04-12 NOTE — Telephone Encounter (Signed)
I have already spoken to this patient today about what he has been calling about for me to check on something.  Please refer him to dermatology as on my previous note.  Thanks.

## 2019-06-15 ENCOUNTER — Other Ambulatory Visit (INDEPENDENT_AMBULATORY_CARE_PROVIDER_SITE_OTHER): Payer: Self-pay | Admitting: Nurse Practitioner

## 2019-06-22 ENCOUNTER — Other Ambulatory Visit: Payer: Self-pay

## 2019-06-22 ENCOUNTER — Encounter: Payer: Self-pay | Admitting: Dermatology

## 2019-06-22 ENCOUNTER — Ambulatory Visit (INDEPENDENT_AMBULATORY_CARE_PROVIDER_SITE_OTHER): Payer: Medicare Other | Admitting: Dermatology

## 2019-06-22 DIAGNOSIS — D225 Melanocytic nevi of trunk: Secondary | ICD-10-CM

## 2019-06-22 DIAGNOSIS — Z1283 Encounter for screening for malignant neoplasm of skin: Secondary | ICD-10-CM | POA: Diagnosis not present

## 2019-06-22 DIAGNOSIS — L0100 Impetigo, unspecified: Secondary | ICD-10-CM | POA: Diagnosis not present

## 2019-06-22 DIAGNOSIS — L309 Dermatitis, unspecified: Secondary | ICD-10-CM

## 2019-06-22 DIAGNOSIS — D229 Melanocytic nevi, unspecified: Secondary | ICD-10-CM

## 2019-06-22 MED ORDER — BETAMETHASONE DIPROPIONATE AUG 0.05 % EX CREA: TOPICAL_CREAM | Freq: Two times a day (BID) | CUTANEOUS | 1 refills | Status: DC

## 2019-06-22 MED ORDER — MUPIROCIN 2 % EX OINT: 1.0000 "application " | TOPICAL_OINTMENT | Freq: Two times a day (BID) | CUTANEOUS | 2 refills | Status: DC

## 2019-07-16 ENCOUNTER — Encounter: Payer: Self-pay | Admitting: Dermatology

## 2019-07-16 NOTE — Progress Notes (Signed)
   Follow-Up Visit   Subjective  Edward Williamson is a 74 y.o. male who presents for the following: Annual Exam (right leg, left leg -new spots).  General skin check Location:  Duration:  Quality: New spots on legs Associated Signs/Symptoms: Modifying Factors:  Severity:  Timing: Context:   The following portions of the chart were reviewed this encounter and updated as appropriate: Tobacco  Allergies  Meds  Problems  Med Hx  Surg Hx  Fam Hx      Objective  Well appearing patient in no apparent distress; mood and affect are within normal limits.  A full examination was performed including scalp, head, eyes, ears, nose, lips, neck, chest, axillae, abdomen, back, buttocks, bilateral upper extremities, bilateral lower extremities, hands, feet, fingers, toes, fingernails, and toenails. All findings within normal limits unless otherwise noted below.   Assessment & Plan  Nevus Mid Back  Annual skin examination  Dermatitis Right Lower Leg - Anterior  Topical betamethasone.  Daily after bathing for up to 4 weeks.  Impetigo Left Lower Leg - Anterior  Topical mupirocin.  Twice daily for 1 week. Skin cancer screening performed today.

## 2019-08-30 DIAGNOSIS — Z23 Encounter for immunization: Secondary | ICD-10-CM | POA: Diagnosis not present

## 2019-09-12 ENCOUNTER — Other Ambulatory Visit (INDEPENDENT_AMBULATORY_CARE_PROVIDER_SITE_OTHER): Payer: Self-pay | Admitting: Internal Medicine

## 2019-10-06 ENCOUNTER — Telehealth (INDEPENDENT_AMBULATORY_CARE_PROVIDER_SITE_OTHER): Payer: Self-pay | Admitting: Internal Medicine

## 2019-10-06 NOTE — Telephone Encounter (Signed)
Okay, let us try 11:45 tomorrow.

## 2019-10-07 ENCOUNTER — Other Ambulatory Visit: Payer: Self-pay

## 2019-10-07 ENCOUNTER — Ambulatory Visit (INDEPENDENT_AMBULATORY_CARE_PROVIDER_SITE_OTHER): Payer: Medicare Other | Admitting: Physician Assistant

## 2019-10-07 ENCOUNTER — Ambulatory Visit (INDEPENDENT_AMBULATORY_CARE_PROVIDER_SITE_OTHER): Payer: Medicare Other | Admitting: Internal Medicine

## 2019-10-07 ENCOUNTER — Encounter: Payer: Self-pay | Admitting: Physician Assistant

## 2019-10-07 DIAGNOSIS — L237 Allergic contact dermatitis due to plants, except food: Secondary | ICD-10-CM | POA: Diagnosis not present

## 2019-10-07 NOTE — Progress Notes (Signed)
   Follow up Visit  Subjective  Edward Williamson is a 74 y.o. male who presents for the following: Skin Problem (poison ivy/rash x weeks  + itch- tx cdb balm). Rash on lower legs. Worse on right shin and only a few bumps on the left calf. Itches. Has used cbd oil on it. Feels his left calf got it from the right shin from crossing his legs in bed at night.  Objective  Well appearing patient in no apparent distress; mood and affect are within normal limits.  A focused examination was performed including lower legs. Relevant physical exam findings are noted in the Assessment and Plan.   Objective  Left Lower Leg - Posterior, Right Lower Leg - Anterior: Inflamed plaque with surrounding papules right shin. Scattered papules left lower calf.  Assessment & Plan  Allergic contact dermatitis due to plants, except food (2) Right Lower Leg - Anterior; Left Lower Leg - Posterior  He has betamethasone dipropionate cream at home that ST had given him at his last visit. He will use this twice a day on the left calf and twice a day with a moist wrap on the right shin.   We also discussed the right shin being part of the "golfers leg" that Dr. Denna Haggard had discussed with him at the previous visit. If it does not improve or returns in this same are in the future we may consider that.

## 2019-10-08 ENCOUNTER — Encounter: Payer: Self-pay | Admitting: Physician Assistant

## 2019-10-12 ENCOUNTER — Ambulatory Visit (INDEPENDENT_AMBULATORY_CARE_PROVIDER_SITE_OTHER): Payer: Medicare Other | Admitting: Internal Medicine

## 2019-10-13 DIAGNOSIS — Z23 Encounter for immunization: Secondary | ICD-10-CM | POA: Diagnosis not present

## 2019-10-19 ENCOUNTER — Encounter (INDEPENDENT_AMBULATORY_CARE_PROVIDER_SITE_OTHER): Payer: Self-pay | Admitting: Internal Medicine

## 2019-10-19 ENCOUNTER — Ambulatory Visit (INDEPENDENT_AMBULATORY_CARE_PROVIDER_SITE_OTHER): Payer: Medicare Other | Admitting: Internal Medicine

## 2019-10-19 ENCOUNTER — Other Ambulatory Visit: Payer: Self-pay

## 2019-10-19 VITALS — BP 130/70 | HR 60 | Ht 71.0 in | Wt 189.2 lb

## 2019-10-19 DIAGNOSIS — E559 Vitamin D deficiency, unspecified: Secondary | ICD-10-CM

## 2019-10-19 DIAGNOSIS — E785 Hyperlipidemia, unspecified: Secondary | ICD-10-CM | POA: Diagnosis not present

## 2019-10-19 NOTE — Progress Notes (Signed)
Metrics: Intervention Frequency ACO  Documented Smoking Status Yearly  Screened one or more times in 24 months  Cessation Counseling or  Active cessation medication Past 24 months  Past 24 months   Guideline developer: UpToDate (See UpToDate for funding source) Date Released: 2014       Wellness Office Visit  Subjective:  Patient ID: Edward Williamson, male    DOB: [birth year]  Age: 74 y.o. MRN: [id number]  CC: This man comes in for follow-up of vitamin D deficiency, hyperlipidemia. HPI  Is doing reasonably well.  He would like to lose more weight but otherwise is keeping busy.  He is active throughout the day.  He takes allopurinol for history of gout. He received his Pfizer booster dose about a week ago.  He did well with this. Past Medical History:  Diagnosis Date  . Erectile dysfunction   . Fatty liver   . 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 Gout   . Hyperlipidemia   . Tick bite   . Vitamin D deficiency   . Vitamin D deficiency 12/24/2018   Past Surgical History:  Procedure Laterality Date  . COLONOSCOPY N/A 02/17/2019   Procedure: COLONOSCOPY;  Surgeon: Daneil Dolin, MD;  Location: [facility];  Service: Endoscopy;  Laterality: N/A;  8:30am  . COLONOSCOPY W/ BIOPSIES  2015   Polyps x 3, no path available; reccomended repeat 5 years (2020)     Family History  Problem Relation Age of Onset  . Scoliosis Mother   . Heart attack Father   . Heart disease Father   . Heart disease Brother   . Heart attack Maternal Grandmother   . Depression Paternal Grandfather        suicide  . Colon cancer Neg Hx     Social History   Social History Narrative  . Not on file   Social History   Tobacco Use  . Smoking status: Never Smoker  . Smokeless tobacco: Never Used  Substance Use Topics  . Alcohol use: Yes    Alcohol/week: 1.0 standard drink    Types: 1 Cans of beer per week    Comment: once per week in summer; cocktail (1) every 1-2 weeks    Current Meds    Medication Sig  . allopurinol (ZYLOPRIM) 300 MG tablet Take 1 tablet by mouth once daily  . augmented betamethasone dipropionate (DIPROLENE-AF) 0.05 % cream Apply topically 2 (two) times daily.  . Boswellia-Glucosamine-Vit D (OSTEO BI-FLEX ONE PER DAY PO) Take 1 tablet by mouth daily.  Marland Kitchen CANNABIDIOL PO Apply 1 application topically daily. Janace Aris  . OVER THE COUNTER MEDICATION Take 1 packet by mouth daily. Doterra Lifelong Vitality Pack Alpha CRS+, Xeo Mega and Microplex VMZ  . vitamin C (ASCORBIC ACID) 500 MG tablet Take 500 mg by mouth daily.  . Vitamin D-Vitamin K (K2 PLUS D3 PO) Take 1 tablet by mouth every other day. In the morning  . Zinc 100 MG TABS Take 1 tablet by mouth daily. UltraZin Zinc      Depression screen [facility] 2/9 04/06/2019 12/24/2018  Decreased Interest 0 0  Down, Depressed, Hopeless 0 0  PHQ - 2 Score 0 0     Objective:   Today's Vitals: BP 130/70   Pulse 60   Ht 5\' 11"  (1.803 m)   Wt 189 lb 3.2 oz (85.8 kg)   BMI 26.39 kg/m  Vitals with BMI 10/19/2019 04/06/2019 02/17/2019  Height 5\' 11"  5\' 11"  -  Weight 189 lbs 3 oz 193 lbs 3 oz -  BMI 12.1 97.58 -  Systolic 832 549 98  Diastolic 70 80 50  Pulse 60 84 72     Physical Exam  He looks systemically well.  He is slightly overweight.  He is lost 4 pounds since last visit.  Blood pressure is well controlled.     Assessment   1. Vitamin D deficiency   2. Hyperlipidemia, unspecified hyperlipidemia type       Tests ordered No orders of the defined types were placed in this encounter.    Plan: 1. He will continue with vitamin D3 supplementation for vitamin D deficiency. 2. He will continue to work on nutrition and exercise regarding hyperlipidemia.  I do not think he needs statin therapy. 3. He will come back to see Judson Roch in about 3 months for an annual medical wellness visit.   No orders of the defined types were placed in this encounter.   Doree Albee, MD

## 2019-11-24 ENCOUNTER — Other Ambulatory Visit (INDEPENDENT_AMBULATORY_CARE_PROVIDER_SITE_OTHER): Payer: Self-pay | Admitting: Internal Medicine

## 2020-01-19 ENCOUNTER — Ambulatory Visit (INDEPENDENT_AMBULATORY_CARE_PROVIDER_SITE_OTHER): Payer: Medicare Other | Admitting: Nurse Practitioner

## 2020-01-19 ENCOUNTER — Encounter (INDEPENDENT_AMBULATORY_CARE_PROVIDER_SITE_OTHER): Payer: Self-pay | Admitting: Nurse Practitioner

## 2020-01-19 ENCOUNTER — Other Ambulatory Visit: Payer: Self-pay

## 2020-01-19 VITALS — BP 114/80 | HR 76 | Temp 97.2°F | Ht 68.0 in | Wt 190.8 lb

## 2020-01-19 DIAGNOSIS — E785 Hyperlipidemia, unspecified: Secondary | ICD-10-CM | POA: Diagnosis not present

## 2020-01-19 DIAGNOSIS — Z0000 Encounter for general adult medical examination without abnormal findings: Secondary | ICD-10-CM

## 2020-01-19 DIAGNOSIS — Z1329 Encounter for screening for other suspected endocrine disorder: Secondary | ICD-10-CM | POA: Diagnosis not present

## 2020-01-19 DIAGNOSIS — E559 Vitamin D deficiency, unspecified: Secondary | ICD-10-CM

## 2020-01-19 DIAGNOSIS — M109 Gout, unspecified: Secondary | ICD-10-CM | POA: Diagnosis not present

## 2020-01-19 NOTE — Patient Instructions (Addendum)
[place] - otc rash application if rash starts to weep   Mr. Lucina Mellow , Thank you for taking time to come for your Medicare Wellness Visit. I appreciate your ongoing commitment to your health goals. Please review the following plan we discussed and let me know if I can assist you in the future.   These are the goals we discussed: Goals   None     This is a list of the screening recommended for you and due dates:  Health Maintenance  Topic Date Due  . COVID-19 Vaccine (4 - Booster for Pfizer series) 04/11/2020  . Tetanus Vaccine  05/11/2028  . Flu Shot  Completed  .  Hepatitis C: One time screening is recommended by Center for Disease Control  (CDC) for  adults born from [birth year] through 1967.   Completed  . Pneumonia vaccines  Completed

## 2020-01-19 NOTE — Progress Notes (Signed)
Subjective:   Edward Williamson is a 75 y.o. male who presents for Medicare Annual/Subsequent preventive examination.  Review of Systems     Cardiac Risk Factors include: advanced age (>43men, >25 women);dyslipidemia;male gender     Objective:    Today's Vitals   01/19/20 0859  BP: 114/80  Pulse: 76  Temp: (!) 97.2 F (36.2 C)  TempSrc: Temporal  SpO2: 93%  Weight: 190 lb 12.8 oz (86.5 kg)  Height: 5\' 8"  (1.727 m)   Body mass index is 29.01 kg/m.  Advanced Directives 01/19/2020 02/17/2019 12/24/2018  Does Patient Have a Medical Advance Directive? Yes Yes Yes  Type of Advance Directive Living will Living will;Healthcare Power of Attorney Living will  Does patient want to make changes to medical advance directive? No - Patient declined - No - Patient declined  Copy of Healthcare Power of Attorney in Chart? - No - copy requested -  Would patient like information on creating a medical advance directive? No - Patient declined - -    Current Medications (verified) Outpatient Encounter Medications as of 01/19/2020  Medication Sig  . allopurinol (ZYLOPRIM) 300 MG tablet Take 1 tablet by mouth once daily  . Boswellia-Glucosamine-Vit D (OSTEO BI-FLEX ONE PER DAY PO) Take 1 tablet by mouth daily.  03/18/2020 CANNABIDIOL PO Apply 1 application topically daily. Marland Kitchen  . OVER THE COUNTER MEDICATION Take 1 packet by mouth daily. Doterra Lifelong Vitality Pack Alpha CRS+, Xeo Mega and Microplex VMZ  . vitamin C (ASCORBIC ACID) 500 MG tablet Take 500 mg by mouth daily.  . Vitamin D-Vitamin K (K2 PLUS D3 PO) Take 1 tablet by mouth every other day. In the morning  . Zinc 100 MG TABS Take 1 tablet by mouth daily. UltraZin Zinc  . [DISCONTINUED] augmented betamethasone dipropionate (DIPROLENE-AF) 0.05 % cream Apply topically 2 (two) times daily. (Patient not taking: Reported on 01/19/2020)   No facility-administered encounter medications on file as of 01/19/2020.    Allergies (verified) Patient has  no known allergies.   History: Past Medical History:  Diagnosis Date  . Erectile dysfunction   . Fatty liver   . 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 Gout   . Hyperlipidemia   . Tick bite   . Vitamin D deficiency   . Vitamin D deficiency 12/24/2018   Past Surgical History:  Procedure Laterality Date  . COLONOSCOPY N/A 02/17/2019   Procedure: COLONOSCOPY;  Surgeon: 04/17/2019, MD;  [facility];  Service: Endoscopy;  Laterality: N/A;  8:30am  . COLONOSCOPY W/ BIOPSIES  2015   Polyps x 3, no path available; reccomended repeat 5 years (2020)   Family History  Problem Relation Age of Onset  . Scoliosis Mother   . Heart attack Father   . Heart disease Father   . Heart disease Brother   . Heart attack Maternal Grandmother   . Depression Paternal Grandfather        suicide  . Colon cancer Neg Hx    Social History   Socioeconomic History  . Marital status: Single    Spouse name: Not on file  . Number of children: 2  . Years of education: 16 years  . Highest education level: Not on file  Occupational History  . Occupation: Retired  . Occupation: 08-17-1982  Tobacco Use  . Smoking status: Never Smoker  . Smokeless tobacco: Never Used  Vaping Use  . Vaping Use: Never used  Substance and Sexual  Activity  . Alcohol use: Yes    Alcohol/week: 1.0 standard drink    Types: 1 Cans of beer per week    Comment: once per week in summer; cocktail (1) every 1-2 weeks  . Drug use: Never  . Sexual activity: Not on file  Other Topics Concern  . Not on file  Social History Narrative  . Not on file   Social Determinants of Health   Financial Resource Strain: Not on file  Food Insecurity: Not on file  Transportation Needs: Not on file  Physical Activity: Not on file  Stress: Not on file  Social Connections: Not on file    Tobacco Counseling Counseling given: Yes   Clinical Intake:  Pre-visit preparation completed: Yes  Pain :  No/denies pain     BMI - recorded: 29.01 Nutritional Status: BMI 25 -29 Overweight Nutritional Risks: None Diabetes: No  How often do you need to have someone help you when you read instructions, pamphlets, or other written materials from your doctor or pharmacy?: 1 - Never What is the last grade level you completed in school?: 4-year college degree Diabetic? No  Interpreter Needed?: No  Information entered by :: Jeralyn Ruths, NP-C   Activities of Daily Living In your present state of health, do you have any difficulty performing the following activities: 01/19/2020  Hearing? Y  Vision? N  Difficulty concentrating or making decisions? Y  Comment short-term memory, mild  Walking or climbing stairs? N  Dressing or bathing? N  Doing errands, shopping? N  Preparing Food and eating ? N  Using the Toilet? N  In the past six months, have you accidently leaked urine? Y  Comment Occasionally if he does not use bathroom in timely manner  Do you have problems with loss of bowel control? N  Managing your Medications? N  Managing your Finances? N  Housekeeping or managing your Housekeeping? N  Some recent data might be hidden    Patient Care Team: Doree Albee, MD as PCP - General (Internal Medicine) Gala Romney Cristopher Estimable, MD as Consulting Physician (Gastroenterology) Lavonna Monarch, MD as Consulting Physician (Dermatology) Clark-Burning, Anderson Malta, PA-C (Inactive) (Dermatology)  Indicate any recent Medical Services you may have received from other than [facility] providers in the past year (date may be approximate).     Assessment:   This is a routine wellness examination for [name].  Hearing/Vision screen  Hearing Screening   125Hz  250Hz  500Hz  1000Hz  2000Hz  3000Hz  4000Hz  6000Hz  8000Hz   Right ear:           Left ear:             Visual Acuity Screening   Right eye Left eye Both eyes  Without correction: 20/100 20/100 20/40  With correction:       Dietary issues and exercise  activities discussed: Current Exercise Habits: The patient does not participate in regular exercise at present (Will work in garden and ride bicycle), Exercise limited by: None identified  Goals   None    Depression Screen PHQ 2/9 Scores 01/19/2020 04/06/2019 12/24/2018  PHQ - 2 Score 0 0 0  PHQ- 9 Score 0 - -  Exception Documentation - Medical reason -    Fall Risk Fall Risk  01/19/2020 04/06/2019 12/24/2018  Falls in the past year? 0 0 0  Number falls in past yr: - 0 -  Injury with Fall? - 0 -  Risk for fall due to : - No Fall Risks -  Follow up -  Falls evaluation completed -    FALL RISK PREVENTION PERTAINING TO THE HOME:  Any stairs in or around the home? No  If so, are there any without handrails? N/A Home free of loose throw rugs in walkways, pet beds, electrical cords, etc? Yes  Adequate lighting in your home to reduce risk of falls? Yes   ASSISTIVE DEVICES UTILIZED TO PREVENT FALLS:  Life alert? No  Use of a cane, walker or w/c? Yes  - Cane if needed Grab bars in the bathroom? No  Shower chair or bench in shower? No  Elevated toilet seat or a handicapped toilet? Yes   TIMED UP AND GO:  Was the test performed? Yes .  Length of time to ambulate 10 feet: 7 sec.   Gait steady and fast without use of assistive device  Cognitive Function:     6CIT Screen 01/19/2020 12/24/2018  What Year? 0 points 0 points  What month? 0 points 0 points  What time? 3 points 0 points  Count back from 20 0 points 0 points  Months in reverse 0 points 0 points  Repeat phrase 0 points 0 points  Total Score 3 0    Immunizations Immunization History  Administered Date(s) Administered  . Fluad Quad(high Dose 65+) 10/29/2018  . Influenza, High Dose Seasonal PF 09/11/2019  . PFIZER SARS-COV-2 Vaccination 03/06/2019, 03/31/2019, 10/13/2019  . Pneumococcal Conjugate-13 10/14/2016  . Pneumococcal Polysaccharide-23 11/05/2013  . Tdap 05/12/2018    TDAP status: Up to date  Flu Vaccine  status: Up to date  Pneumococcal vaccine status: Up to date  Covid-19 vaccine status: Completed vaccines  Qualifies for Shingles Vaccine? Yes   Zostavax completed No   Shingrix Completed?: No.    Education has been provided regarding the importance of this vaccine. Patient has been advised to call insurance company to determine out of pocket expense if they have not yet received this vaccine. Advised may also receive vaccine at local pharmacy or Health Dept. Verbalized acceptance and understanding.  Screening Tests Health Maintenance  Topic Date Due  . COVID-19 Vaccine (4 - Booster for Pfizer series) 04/11/2020  . TETANUS/TDAP  05/11/2028  . INFLUENZA VACCINE  Completed  . Hepatitis C Screening  Completed  . PNA vac Low Risk Adult  Completed    Health Maintenance  There are no preventive care reminders to display for this patient.  Colorectal cancer screening: No longer required.   Lung Cancer Screening: (Low Dose CT Chest recommended if Age 48-80 years, 30 pack-year currently smoking OR have quit w/in 15years.) does not qualify.   Lung Cancer Screening Referral: N/A  Additional Screening:  Hepatitis C Screening: does not qualify; Completed 12/2018  Vision Screening: Recommended annual ophthalmology exams for early detection of glaucoma and other disorders of the eye. Is the patient up to date with their annual eye exam?  Yes  Who is the provider or what is the name of the office in which the patient attends annual eye exams? [facility] If pt is not established with a provider, would they like to be referred to a provider to establish care? No .   Dental Screening: Recommended annual dental exams for proper oral hygiene  Community Resource Referral / Chronic Care Management: CRR required this visit?  No   CCM required this visit?  No      Plan:     Patient did have concerns of probable contact dermatitis secondary to exposure to poison ivy.  He does have a rash on  his  right knee that is red and raised.  He tells me it is no longer itchy.  It erupted about 1 week ago.  He has used triamcinolone cream that he received from a family member.  Plan for this today is to use over-the-counter treatments unless itchiness returns or worsens at which point he was told to call the office and I can prescribe triamcinolone cream for him to use as needed for itching.  He tells me he understands.  We did discuss risk versus benefits regarding prostate cancer screening and he has elected to not undergo PSA level checked today.  We did discuss if he experiences any worsening nocturia, difficulty initiating stream of urine, or hematuria that he should notify us right away.  He tells me he understands.  Up-to-date with all vaccines except for shingles, he is encouraged to consider this in the future he does not have insurance coverage that will help him with payment of this vaccine so he is can hold off for right now.  He is due for blood work for monitoring his other chronic disease states.  We will order this today.  I have personally reviewed and noted the following in the patient's chart:   . Medical and social history . Use of alcohol, tobacco or illicit drugs  . Current medications and supplements . Functional ability and status . Nutritional status . Physical activity . Advanced directives . List of other physicians . Hospitalizations, surgeries, and ER visits in previous 12 months . Vitals . Screenings to include cognitive, depression, and falls . Referrals and appointments  In addition, I have reviewed and discussed with patient certain preventive protocols, quality metrics, and best practice recommendations. A written personalized care plan for preventive services as well as general preventive health recommendations were provided to patient.    He will follow-up in 3 months for office visit unless he needs to be seen sooner.  He will also follow-up for a wellness  visit in 1 year.  Ailene Ards, NP   01/19/2020

## 2020-01-20 ENCOUNTER — Other Ambulatory Visit (INDEPENDENT_AMBULATORY_CARE_PROVIDER_SITE_OTHER): Payer: Self-pay | Admitting: Nurse Practitioner

## 2020-01-20 LAB — TSH: TSH: 0.82 m[IU]/L (ref 0.40–4.50)

## 2020-01-20 LAB — CBC WITH DIFFERENTIAL/PLATELET
Absolute Monocytes: 577 {cells}/uL (ref 200–950)
Basophils Absolute: 31 {cells}/uL (ref 0–200)
Basophils Relative: 0.6 %
Eosinophils Absolute: 83 {cells}/uL (ref 15–500)
Eosinophils Relative: 1.6 %
HCT: 48.5 % (ref 38.5–50.0)
Hemoglobin: 16.8 g/dL (ref 13.2–17.1)
Lymphs Abs: 2475 {cells}/uL (ref 850–3900)
MCH: 32.7 pg (ref 27.0–33.0)
MCHC: 34.6 g/dL (ref 32.0–36.0)
MCV: 94.4 fL (ref 80.0–100.0)
MPV: 12.2 fL (ref 7.5–12.5)
Monocytes Relative: 11.1 %
Neutro Abs: 2033 {cells}/uL (ref 1500–7800)
Neutrophils Relative %: 39.1 %
Platelets: 221 Thousand/uL (ref 140–400)
RBC: 5.14 Million/uL (ref 4.20–5.80)
RDW: 12.7 % (ref 11.0–15.0)
Total Lymphocyte: 47.6 %
WBC: 5.2 Thousand/uL (ref 3.8–10.8)

## 2020-01-20 LAB — COMPLETE METABOLIC PANEL WITH GFR
AG Ratio: 1.7 (calc) (ref 1.0–2.5)
ALT: 12 U/L (ref 9–46)
AST: 14 U/L (ref 10–35)
Albumin: 4.6 g/dL (ref 3.6–5.1)
Alkaline phosphatase (APISO): 76 U/L (ref 35–144)
BUN: 12 mg/dL (ref 7–25)
CO2: 28 mmol/L (ref 20–32)
Calcium: 10.4 mg/dL — ABNORMAL HIGH (ref 8.6–10.3)
Chloride: 101 mmol/L (ref 98–110)
Creat: 0.97 mg/dL (ref 0.70–1.18)
GFR, Est African American: 88 mL/min/{1.73_m2} (ref >=60)
GFR, Est Non African American: 76 mL/min/{1.73_m2} (ref >=60)
Globulin: 2.7 g/dL (calc) (ref 1.9–3.7)
Glucose, Bld: 95 mg/dL (ref 65–99)
Potassium: 5.1 mmol/L (ref 3.5–5.3)
Sodium: 141 mmol/L (ref 135–146)
Total Bilirubin: 0.9 mg/dL (ref 0.2–1.2)
Total Protein: 7.3 g/dL (ref 6.1–8.1)

## 2020-01-20 LAB — LIPID PANEL
Cholesterol: 229 mg/dL — ABNORMAL HIGH (ref <200)
HDL: 49 mg/dL (ref >=40)
LDL Cholesterol (Calc): 154 mg/dL (calc) — ABNORMAL HIGH
Non-HDL Cholesterol (Calc): 180 mg/dL (calc) — ABNORMAL HIGH (ref <130)
Total CHOL/HDL Ratio: 4.7 (calc) (ref <5.0)
Triglycerides: 132 mg/dL (ref <150)

## 2020-01-20 LAB — URIC ACID: Uric Acid, Serum: 4.2 mg/dL (ref 4.0–8.0)

## 2020-01-20 LAB — VITAMIN D 25 HYDROXY (VIT D DEFICIENCY, FRACTURES): Vit D, 25-Hydroxy: 63 ng/mL (ref 30–100)

## 2020-01-25 DIAGNOSIS — H3561 Retinal hemorrhage, right eye: Secondary | ICD-10-CM | POA: Diagnosis not present

## 2020-02-29 ENCOUNTER — Other Ambulatory Visit (INDEPENDENT_AMBULATORY_CARE_PROVIDER_SITE_OTHER): Payer: Self-pay | Admitting: Internal Medicine

## 2020-04-18 ENCOUNTER — Ambulatory Visit (INDEPENDENT_AMBULATORY_CARE_PROVIDER_SITE_OTHER): Payer: Medicare Other | Admitting: Internal Medicine

## 2020-04-18 ENCOUNTER — Encounter (INDEPENDENT_AMBULATORY_CARE_PROVIDER_SITE_OTHER): Payer: Self-pay | Admitting: Internal Medicine

## 2020-04-18 ENCOUNTER — Other Ambulatory Visit: Payer: Self-pay

## 2020-04-18 VITALS — BP 134/82 | HR 56 | Temp 97.3°F | Ht 71.0 in | Wt 195.2 lb

## 2020-04-18 DIAGNOSIS — H9103 Ototoxic hearing loss, bilateral: Secondary | ICD-10-CM

## 2020-04-18 DIAGNOSIS — E559 Vitamin D deficiency, unspecified: Secondary | ICD-10-CM

## 2020-04-18 DIAGNOSIS — M109 Gout, unspecified: Secondary | ICD-10-CM | POA: Diagnosis not present

## 2020-04-18 DIAGNOSIS — E785 Hyperlipidemia, unspecified: Secondary | ICD-10-CM | POA: Diagnosis not present

## 2020-04-18 MED ORDER — ALLOPURINOL 300 MG PO TABS: 300.0000 mg | ORAL_TABLET | Freq: Every day | ORAL | 1 refills | Status: AC

## 2020-04-18 NOTE — Progress Notes (Signed)
Metrics: Intervention Frequency ACO  Documented Smoking Status Yearly  Screened one or more times in 24 months  Cessation Counseling or  Active cessation medication Past 24 months  Past 24 months   Guideline developer: UpToDate (See UpToDate for funding source) Date Released: 2014       Wellness Office Visit  Subjective:  Patient ID: Edward Williamson, male    DOB: 1945-06-16  Age: 75 y.o. MRN: [id number]  CC: This man comes in for follow-up of gout, vitamin D deficiency, hyperlipidemia. HPI  His main concern at the present time is hearing loss and it seems to be getting worse.  He would like another review by ENT and he probably does need some kind of hearing aid. He had some sort of vertiginous/dizzy spell about a month ago when he was in [place].  He did not lose consciousness, there was no chest pain or palpitations and there were no speech difficulties or altered mentation.  He recovered any probably thinks this was due to dehydration. He continues on allopurinol for prevention of gout attacks. He continues on vitamin D3 supplementation for vitamin D deficiency.  Previously, he did have hypercalcemia and we will check this again. Past Medical History:  Diagnosis Date  . Erectile dysfunction   . Fatty liver   . 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 Gout   . Hyperlipidemia   . Tick bite   . Vitamin D deficiency   . Vitamin D deficiency 12/24/2018   Past Surgical History:  Procedure Laterality Date  . COLONOSCOPY N/A 02/17/2019   Procedure: COLONOSCOPY;  Surgeon: Daneil Dolin, MD;  [facility];  Service: Endoscopy;  Laterality: N/A;  8:30am  . COLONOSCOPY W/ BIOPSIES  2015   Polyps x 3, no path available; reccomended repeat 5 years (2020)     Family History  Problem Relation Age of Onset  . Scoliosis Mother   . Heart attack Father   . Heart disease Father   . Heart disease Brother   . Heart attack Maternal Grandmother   . Depression Paternal Grandfather         suicide  . Colon cancer Neg Hx     Social History   Social History Narrative  . Not on file   Social History   Tobacco Use  . Smoking status: Never Smoker  . Smokeless tobacco: Never Used  Substance Use Topics  . Alcohol use: Yes    Alcohol/week: 1.0 standard drink    Types: 1 Cans of beer per week    Comment: once per week in summer; cocktail (1) every 1-2 weeks    Current Meds  Medication Sig  . Boswellia-Glucosamine-Vit D (OSTEO BI-FLEX ONE PER DAY PO) Take 1 tablet by mouth daily.  Marland Kitchen CANNABIDIOL PO Apply 1 application topically daily. Janace Aris  . OVER THE COUNTER MEDICATION Take 1 packet by mouth daily. Doterra Lifelong Vitality Pack Alpha CRS+, Xeo Mega and Microplex VMZ  . vitamin C (ASCORBIC ACID) 500 MG tablet Take 500 mg by mouth daily.  . Vitamin D-Vitamin K (K2 PLUS D3 PO) Take 1 tablet by mouth every other day. In the morning  . Zinc 100 MG TABS Take 1 tablet by mouth daily. UltraZin Zinc  . [DISCONTINUED] allopurinol (ZYLOPRIM) 300 MG tablet Take 1 tablet by mouth once daily     [place] Office Visit from 04/18/2020 in [facility]  PHQ-9 Total Score 0      Objective:  Today's Vitals: BP 134/82   Pulse (!) 56   Temp (!) 97.3 F (36.3 C) (Temporal)   Ht 5\' 11"  (1.803 m)   Wt 195 lb 3.2 oz (88.5 kg)   SpO2 98%   BMI 27.22 kg/m  Vitals with BMI 04/18/2020 01/19/2020 10/19/2019  Height 5\' 11"  5\' 8"  5\' 11"   Weight 195 lbs 3 oz 190 lbs 13 oz 189 lbs 3 oz  BMI 27.24 88.82 80.0  Systolic 349 179 150  Diastolic 82 80 70  Pulse 56 76 60     Physical Exam  He looks systemically well.  Blood pressure acceptable.  Weight stable.     Assessment   1. Gout, unspecified cause, unspecified chronicity, unspecified site   2. Hyperlipidemia, unspecified hyperlipidemia type   3. Vitamin D deficiency   4. Hypercalcemia   5. Ototoxic hearing loss of both ears       Tests ordered Orders Placed This Encounter  Procedures  .  COMPLETE METABOLIC PANEL WITH GFR  . VITAMIN D 25 Hydroxy (Vit-D Deficiency, Fractures)  . Ambulatory referral to ENT     Plan: 1. Continue with allopurinol and I refill this today. 2. Continue with vitamin D3 supplementation and we will check vitamin D levels along with calcium. 3. I will refer him to Dr. Benjamine Mola, ENT in [facility] for his hearing loss and further recommendations. 4. Follow-up as scheduled with Sarah.   Meds ordered this encounter  Medications  . allopurinol (ZYLOPRIM) 300 MG tablet    Sig: Take 1 tablet (300 mg total) by mouth daily.    Dispense:  180 tablet    Refill:  1    Kelli Egolf Luther Parody, MD

## 2020-04-19 LAB — COMPLETE METABOLIC PANEL WITH GFR
AG Ratio: 1.6 (calc) (ref 1.0–2.5)
ALT: 13 U/L (ref 9–46)
AST: 15 U/L (ref 10–35)
Albumin: 4.2 g/dL (ref 3.6–5.1)
Alkaline phosphatase (APISO): 74 U/L (ref 35–144)
BUN: 13 mg/dL (ref 7–25)
CO2: 28 mmol/L (ref 20–32)
Calcium: 9.7 mg/dL (ref 8.6–10.3)
Chloride: 104 mmol/L (ref 98–110)
Creat: 0.99 mg/dL (ref 0.70–1.18)
GFR, Est African American: 85 mL/min/{1.73_m2} (ref >=60)
GFR, Est Non African American: 74 mL/min/{1.73_m2} (ref >=60)
Globulin: 2.6 g/dL (calc) (ref 1.9–3.7)
Glucose, Bld: 103 mg/dL — ABNORMAL HIGH (ref 65–99)
Potassium: 5 mmol/L (ref 3.5–5.3)
Sodium: 140 mmol/L (ref 135–146)
Total Bilirubin: 0.7 mg/dL (ref 0.2–1.2)
Total Protein: 6.8 g/dL (ref 6.1–8.1)

## 2020-04-19 LAB — VITAMIN D 25 HYDROXY (VIT D DEFICIENCY, FRACTURES): Vit D, 25-Hydroxy: 53 ng/mL (ref 30–100)

## 2020-04-21 DIAGNOSIS — H838X3 Other specified diseases of inner ear, bilateral: Secondary | ICD-10-CM | POA: Diagnosis not present

## 2020-04-21 DIAGNOSIS — H903 Sensorineural hearing loss, bilateral: Secondary | ICD-10-CM | POA: Diagnosis not present

## 2020-04-22 DIAGNOSIS — Z23 Encounter for immunization: Secondary | ICD-10-CM | POA: Diagnosis not present

## 2020-06-05 ENCOUNTER — Telehealth (INDEPENDENT_AMBULATORY_CARE_PROVIDER_SITE_OTHER): Payer: Self-pay | Admitting: Internal Medicine

## 2020-06-05 NOTE — Telephone Encounter (Signed)
I agree with this plan.

## 2020-06-05 NOTE — Telephone Encounter (Signed)
Santiago Glad did ask me about recommendations regarding this situation and she was told to refer patient to urgent care for tick removal and treatment as we do not have access to sterilized equipment to complete tick removal in this office.

## 2020-08-03 DIAGNOSIS — R7302 Impaired glucose tolerance (oral): Secondary | ICD-10-CM | POA: Diagnosis not present

## 2020-08-03 DIAGNOSIS — E785 Hyperlipidemia, unspecified: Secondary | ICD-10-CM | POA: Diagnosis not present

## 2020-08-03 DIAGNOSIS — M109 Gout, unspecified: Secondary | ICD-10-CM | POA: Diagnosis not present

## 2020-10-02 DIAGNOSIS — Z23 Encounter for immunization: Secondary | ICD-10-CM | POA: Diagnosis not present

## 2020-10-13 IMAGING — CR RIGHT LITTLE FINGER 2+V
4 series · 4 of 4 positions shown · non-contrast
Comparison: None

CLINICAL DATA: Laceration RIGHT little finger from an RC airplane
propeller

EXAM:
RIGHT LITTLE FINGER 2+V

[pa]
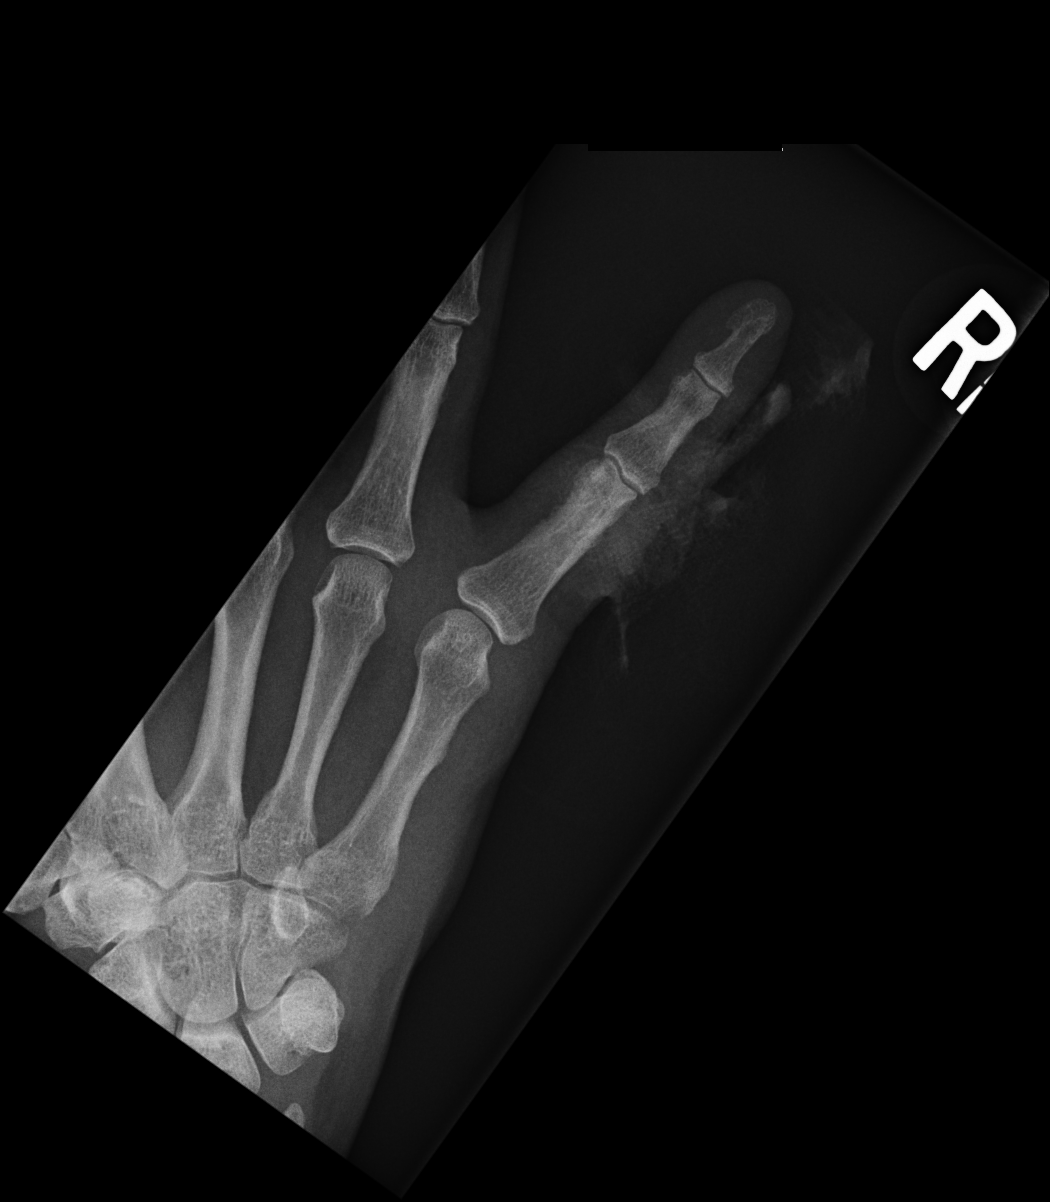

[oblique]
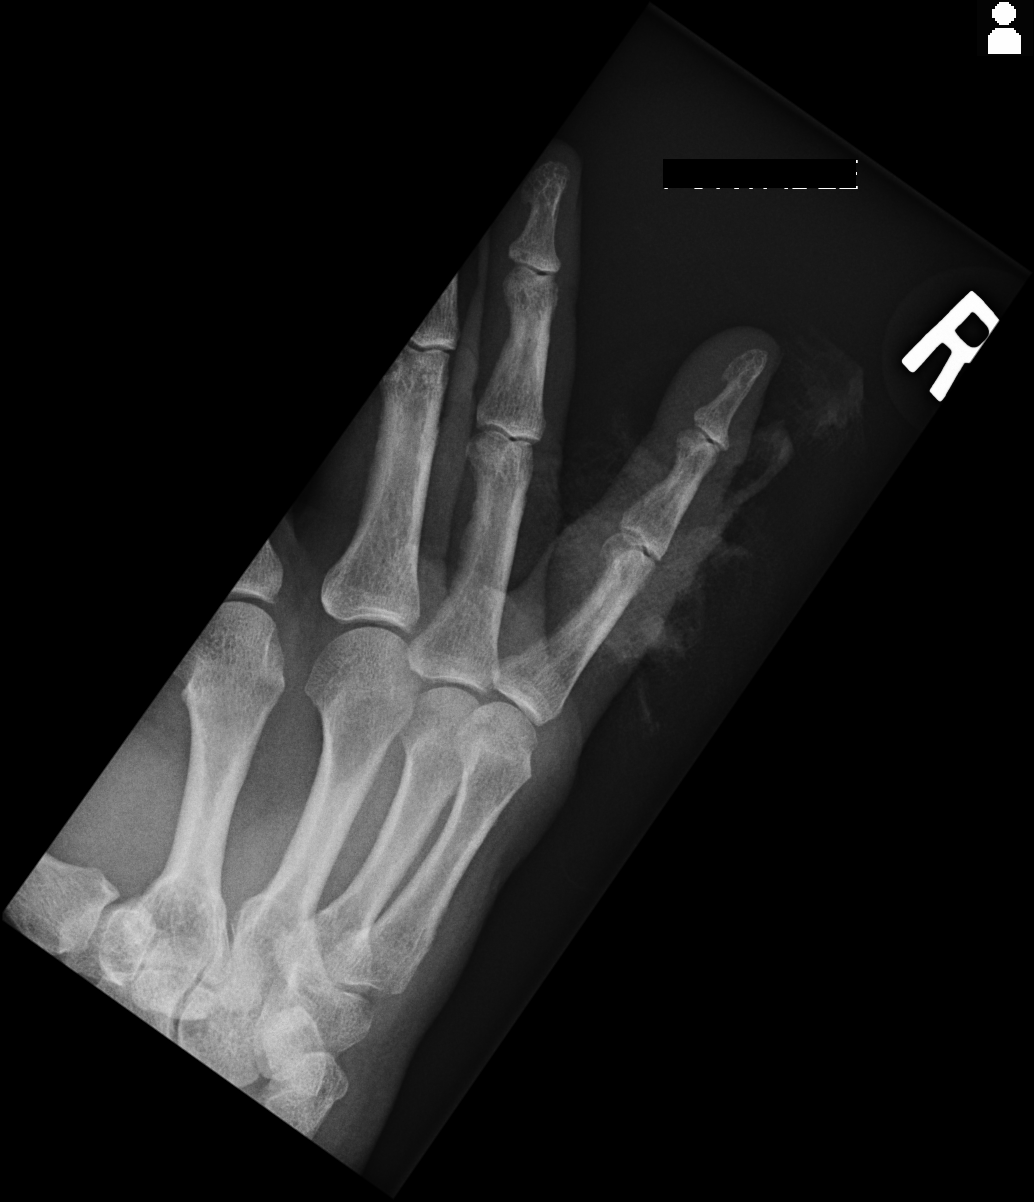

[lat (1 of 2)]
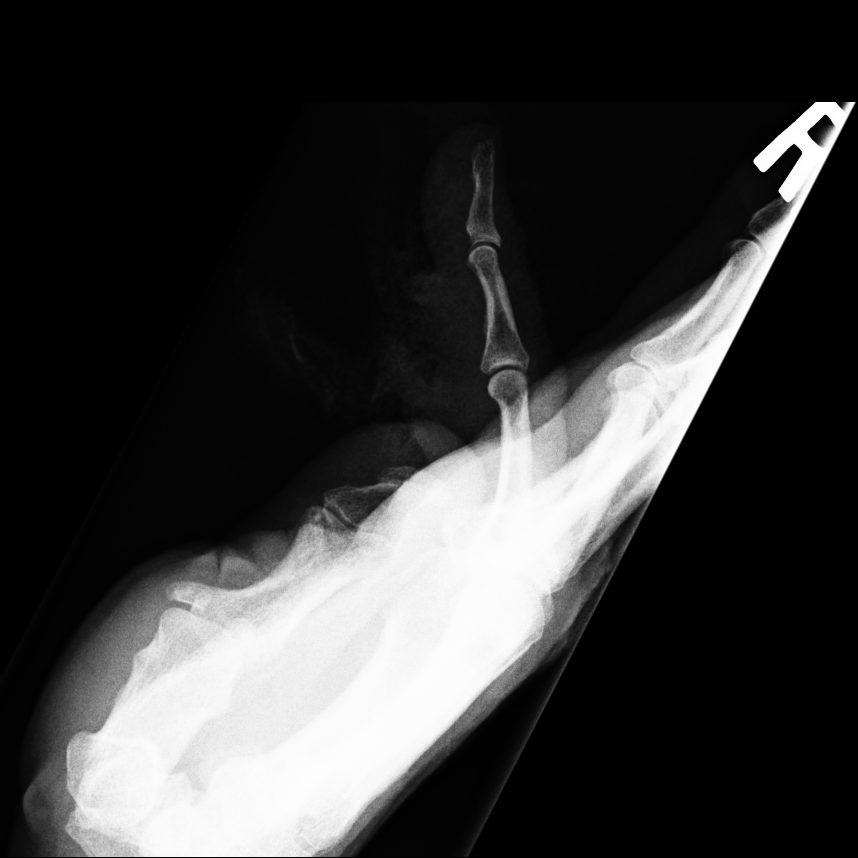

[lat (2 of 2)]
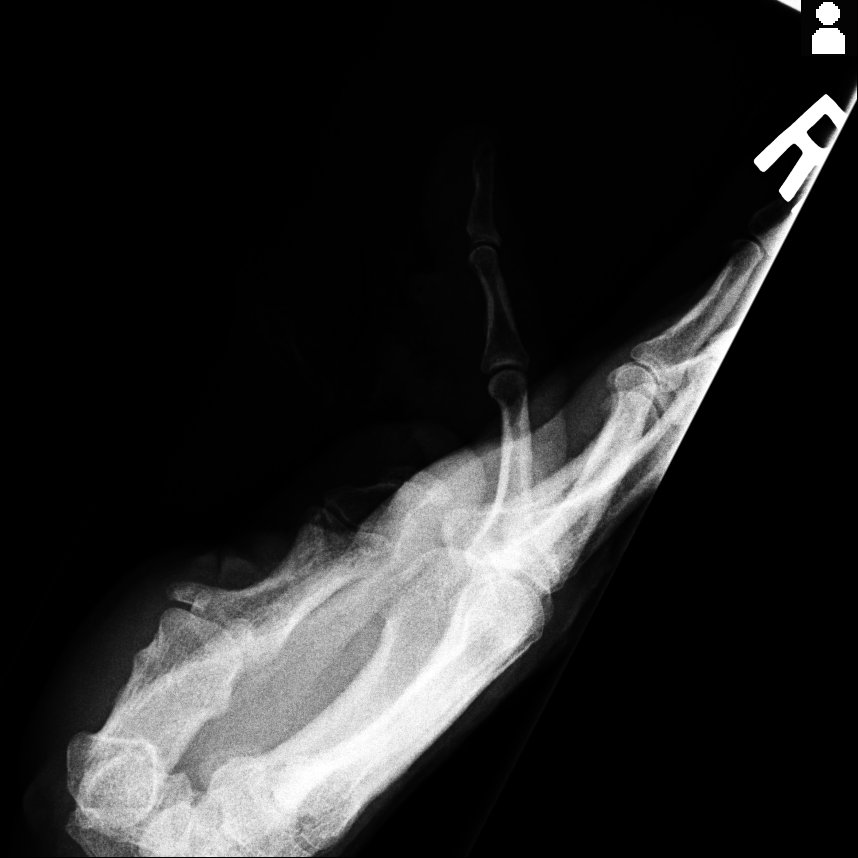

[4 of 4 positions shown; findings below may reference images not displayed]

FINDINGS: Dressing artifacts and soft tissue irregularity at ulnar and volar
aspects of RIGHT little finger.

Osseous mineralization normal.

Joint spaces preserved.

No acute fracture, dislocation, or bone destruction.
IMPRESSION: No acute osseous abnormalities.

## 2021-01-25 ENCOUNTER — Encounter (INDEPENDENT_AMBULATORY_CARE_PROVIDER_SITE_OTHER): Payer: Self-pay

## 2021-01-25 ENCOUNTER — Encounter (INDEPENDENT_AMBULATORY_CARE_PROVIDER_SITE_OTHER): Payer: Medicare Other | Admitting: Nurse Practitioner
